# Patient Record
Sex: Female | Born: 1950 | Race: Black or African American | Hispanic: No | Smoking: Former smoker
Health system: Southern US, Community
[De-identification: ages and names within clinical notes are randomized; demographics above are authoritative.]

## PROBLEM LIST (undated history)

## (undated) DIAGNOSIS — C50412 Malignant neoplasm of upper-outer quadrant of left female breast: Secondary | ICD-10-CM

## (undated) DIAGNOSIS — C801 Malignant (primary) neoplasm, unspecified: Secondary | ICD-10-CM

## (undated) DIAGNOSIS — Z9221 Personal history of antineoplastic chemotherapy: Secondary | ICD-10-CM

## (undated) DIAGNOSIS — Z86010 Personal history of colonic polyps: Secondary | ICD-10-CM

## (undated) DIAGNOSIS — G620 Drug-induced polyneuropathy: Secondary | ICD-10-CM

## (undated) DIAGNOSIS — C50919 Malignant neoplasm of unspecified site of unspecified female breast: Secondary | ICD-10-CM

## (undated) DIAGNOSIS — Z923 Personal history of irradiation: Secondary | ICD-10-CM

## (undated) DIAGNOSIS — T451X5A Adverse effect of antineoplastic and immunosuppressive drugs, initial encounter: Secondary | ICD-10-CM

## (undated) HISTORY — DX: Drug-induced polyneuropathy: G62.0

## (undated) HISTORY — DX: Adverse effect of antineoplastic and immunosuppressive drugs, initial encounter: T45.1X5A

## (undated) HISTORY — DX: Personal history of colonic polyps: Z86.010

## (undated) HISTORY — DX: Malignant neoplasm of upper-outer quadrant of left female breast: C50.412

---

## 1975-10-31 HISTORY — PX: TUBAL LIGATION: SHX77

## 2005-10-30 DIAGNOSIS — Z860100 Personal history of colon polyps, unspecified: Secondary | ICD-10-CM

## 2005-10-30 HISTORY — DX: Personal history of colonic polyps: Z86.010

## 2005-10-30 HISTORY — DX: Personal history of colon polyps, unspecified: Z86.0100

## 2006-01-30 ENCOUNTER — Ambulatory Visit: Payer: Self-pay | Admitting: Family Medicine

## 2006-02-15 ENCOUNTER — Ambulatory Visit: Payer: Self-pay | Admitting: General Surgery

## 2007-02-06 ENCOUNTER — Ambulatory Visit: Payer: Self-pay | Admitting: Family Medicine

## 2008-02-04 ENCOUNTER — Encounter: Payer: Self-pay | Admitting: Family Medicine

## 2008-02-11 ENCOUNTER — Ambulatory Visit: Payer: Self-pay | Admitting: Family Medicine

## 2008-02-28 ENCOUNTER — Encounter: Payer: Self-pay | Admitting: Family Medicine

## 2008-03-25 ENCOUNTER — Ambulatory Visit: Payer: Self-pay | Admitting: Podiatry

## 2008-03-30 ENCOUNTER — Encounter: Payer: Self-pay | Admitting: Family Medicine

## 2008-04-22 DIAGNOSIS — F17210 Nicotine dependence, cigarettes, uncomplicated: Secondary | ICD-10-CM | POA: Insufficient documentation

## 2009-03-18 ENCOUNTER — Ambulatory Visit: Payer: Self-pay | Admitting: Family Medicine

## 2010-10-30 HISTORY — PX: BUNIONECTOMY: SHX129

## 2010-12-30 ENCOUNTER — Ambulatory Visit: Payer: Self-pay | Admitting: Family Medicine

## 2011-02-28 ENCOUNTER — Ambulatory Visit: Payer: Self-pay | Admitting: General Surgery

## 2012-02-29 ENCOUNTER — Ambulatory Visit: Payer: Self-pay | Admitting: Family Medicine

## 2013-03-12 ENCOUNTER — Ambulatory Visit: Payer: Self-pay | Admitting: Family Medicine

## 2013-05-15 ENCOUNTER — Encounter: Payer: Self-pay | Admitting: *Deleted

## 2014-06-02 ENCOUNTER — Encounter: Payer: Self-pay | Admitting: Podiatry

## 2014-06-02 ENCOUNTER — Ambulatory Visit (INDEPENDENT_AMBULATORY_CARE_PROVIDER_SITE_OTHER): Payer: 59

## 2014-06-02 ENCOUNTER — Ambulatory Visit (INDEPENDENT_AMBULATORY_CARE_PROVIDER_SITE_OTHER): Payer: 59 | Admitting: Podiatry

## 2014-06-02 VITALS — BP 126/75 | HR 66 | Resp 16 | Ht 64.0 in | Wt 200.0 lb

## 2014-06-02 DIAGNOSIS — M779 Enthesopathy, unspecified: Secondary | ICD-10-CM

## 2014-06-02 MED ORDER — TRIAMCINOLONE ACETONIDE 10 MG/ML IJ SUSP
10.0000 mg | Freq: Once | INTRAMUSCULAR | Status: AC
Start: 2014-06-02 — End: 2014-06-02
  Administered 2014-06-02: 10 mg

## 2014-06-02 NOTE — Progress Notes (Signed)
Subjective:     Patient ID: Theresa Acosta, female   DOB: Feb 02, 1951, 63 y.o.   MRN: [id number]  Foot Pain   patient presents stating that I'm having a lot of pain in the top of my right foot and I am working 16 hours a day and on my feet continuously   Review of Systems  All other systems reviewed and are negative.      Objective:   Physical Exam  Nursing note and vitals reviewed. Constitutional: She is oriented to person, place, and time.  Cardiovascular: Intact distal pulses.   Musculoskeletal: Normal range of motion.  Neurological: She is oriented to person, place, and time.  Skin: Skin is warm.   neurovascular status intact with muscle strength adequate and range of motion of the subtalar and midtarsal joint within normal limits. Patient is found to have good digital perfusion and is noted to have some diminishment of arch height upon evaluation and is noted on the dorsum of the right foot have quite a bit of discomfort in the tendon around anterior tibial proximal to its insertion. No indication of muscle loss currently    Assessment:     Tendinitis of the dorsal anterior tibial muscle group with structure causing part of the problem and arch height being a part of the problem    Plan:     H&P and x-rays reviewed. I carefully injected the sheath of the tendon 3 mg Kenalog 5 mg I can Marcaine mixture and advised on physical therapy and long-term orthotics which were scanned today. Also dispensed fascially brace to lift the arch up and take stress off the tendon

## 2014-06-11 ENCOUNTER — Encounter: Payer: Self-pay | Admitting: *Deleted

## 2014-06-11 NOTE — Progress Notes (Signed)
SPOKE WITH PT LETTING HER KNOW ORTHOTICS ARE HERE. PT HAS APPT WITH DR REGAL ON 8.25.15

## 2014-06-23 ENCOUNTER — Ambulatory Visit (INDEPENDENT_AMBULATORY_CARE_PROVIDER_SITE_OTHER): Payer: 59 | Admitting: Podiatry

## 2014-06-23 ENCOUNTER — Encounter: Payer: Self-pay | Admitting: Podiatry

## 2014-06-23 VITALS — BP 128/69 | HR 74 | Resp 12

## 2014-06-23 DIAGNOSIS — M779 Enthesopathy, unspecified: Secondary | ICD-10-CM

## 2014-06-23 MED ORDER — TRIAMCINOLONE ACETONIDE 10 MG/ML IJ SUSP
10.0000 mg | Freq: Once | INTRAMUSCULAR | Status: AC
  Administered 2014-06-23: 10 mg

## 2014-06-23 NOTE — Progress Notes (Signed)
Subjective:     Patient ID: Theresa Acosta, female   DOB: 1951/09/26, 63 y.o.   MRN: [id number]  HPI patient states that I still having pain not as intense but present and I still have been working 16 hours a day   Review of Systems     Objective:   Physical Exam Neurovascular status intact with continued discomfort on the dorsum of the right foot around the anterior tibial tendon and ankle joint with the pain been slightly medial over previous    Assessment:     Tendinitis still present with excessive activity probably part of the problem    Plan:     Reinjected the plantar fascia 3 mg Kenalog 5 mg Xylocaine and advised on heat followed by ice and then went ahead and dispensed orthotics with all instructions on usage

## 2014-07-21 ENCOUNTER — Ambulatory Visit: Payer: 59 | Admitting: Podiatry

## 2015-10-31 DIAGNOSIS — Z9221 Personal history of antineoplastic chemotherapy: Secondary | ICD-10-CM

## 2015-10-31 DIAGNOSIS — Z923 Personal history of irradiation: Secondary | ICD-10-CM

## 2015-10-31 DIAGNOSIS — G620 Drug-induced polyneuropathy: Secondary | ICD-10-CM

## 2015-10-31 DIAGNOSIS — T451X5A Adverse effect of antineoplastic and immunosuppressive drugs, initial encounter: Secondary | ICD-10-CM

## 2015-10-31 HISTORY — DX: Personal history of irradiation: Z92.3

## 2015-10-31 HISTORY — DX: Personal history of antineoplastic chemotherapy: Z92.21

## 2015-10-31 HISTORY — DX: Drug-induced polyneuropathy: G62.0

## 2015-10-31 HISTORY — DX: Drug-induced polyneuropathy: T45.1X5A

## 2015-12-14 ENCOUNTER — Encounter: Payer: Self-pay | Admitting: *Deleted

## 2016-03-01 ENCOUNTER — Encounter: Payer: Self-pay | Admitting: General Surgery

## 2016-03-02 ENCOUNTER — Encounter: Payer: Self-pay | Admitting: General Surgery

## 2016-03-02 ENCOUNTER — Ambulatory Visit (INDEPENDENT_AMBULATORY_CARE_PROVIDER_SITE_OTHER): Payer: 59 | Admitting: General Surgery

## 2016-03-02 ENCOUNTER — Other Ambulatory Visit: Payer: Self-pay | Admitting: Family Medicine

## 2016-03-02 VITALS — BP 138/82 | HR 78 | Resp 12 | Ht 62.0 in | Wt 180.0 lb

## 2016-03-02 DIAGNOSIS — Z1231 Encounter for screening mammogram for malignant neoplasm of breast: Secondary | ICD-10-CM

## 2016-03-02 DIAGNOSIS — Z86010 Personal history of colonic polyps: Secondary | ICD-10-CM | POA: Diagnosis not present

## 2016-03-02 MED ORDER — POLYETHYLENE GLYCOL 3350 17 GM/SCOOP PO POWD: ORAL | Status: DC

## 2016-03-02 NOTE — Progress Notes (Signed)
Patient ID: Theresa Acosta, female   DOB: September 18, 1951, 65 y.o.   MRN: [id number]  Chief Complaint  Patient presents with  . Colonoscopy    HPI Theresa Acosta is a 65 y.o. female.  Who presents for a colonoscopy discussion. The last colonoscopy was completed in 2012 . Denies any gastrointestinal issues. Bowels move regular( every other day) and no bleeding noted. Denies family history of cancer. 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  I personally reviewed the patient's history.  HPI  Past Medical History  Diagnosis Date  . Personal history of colonic polyps 2007    Past Surgical History  Procedure Laterality Date  . Tubal ligation  1977  . Colonoscopy  2007 ( serated adenoma of the rectum), 2012 (normal)    Dr Bary Castilla  . Bunionectomy  2012    Family History  Problem Relation Age of Onset  . Cancer Neg Hx     Social History Social History  Substance Use Topics  . Smoking status: Current Some Day Smoker -- 1.00 packs/day for 8 years  . Smokeless tobacco: Never Used  . Alcohol Use: 0.0 oz/week    0 Standard drinks or equivalent per week    No Known Allergies  Current Outpatient Prescriptions  Medication Sig Dispense Refill  . polyethylene glycol powder (GLYCOLAX/MIRALAX) powder 255 grams one bottle for colonoscopy prep 255 g 0   No current facility-administered medications for this visit.    Review of Systems Review of Systems  Constitutional: Negative.   Respiratory: Negative.   Cardiovascular: Negative.   Gastrointestinal: Negative for nausea, diarrhea, constipation and blood in stool.    Blood pressure 138/82, pulse 78, resp. rate 12, height 5\' 2"  (1.575 m), weight 180 lb (81.647 kg).  Physical Exam Physical Exam  Constitutional: She is oriented to person, place, and time. She appears well-developed and well-nourished.  HENT:  Mouth/Throat: Oropharynx is clear and moist.   Eyes: Conjunctivae are normal. No scleral icterus.  Neck: Neck supple.  Cardiovascular: Normal rate, regular rhythm and normal heart sounds.   Pulmonary/Chest: Effort normal and breath sounds normal.  Lymphadenopathy:    She has no cervical adenopathy.  Neurological: She is alert and oriented to person, place, and time.  Skin: Skin is warm and dry.  Psychiatric: Her behavior is normal.    Data Reviewed 2007 colonoscopy report showed a serrated adenoma of the rectum as well as several hyperplastic polyps.  Assessment    Previous colonic polyp.    Plan    Colonoscopy with possible biopsy/polypectomy prn: Information regarding the procedure, including its potential risks and complications (including but not limited to perforation of the bowel, which may require emergency surgery to repair, and bleeding) was verbally given to the patient. Educational information regarding lower intestinal endoscopy was given to the patient. Written instructions for how to complete the bowel prep using Miralax were provided. The importance of drinking ample fluids to avoid dehydration as a result of the prep emphasized.     Patient has been scheduled for a colonoscopy on 03-21-16 at [facility].   PCP:  Margarita Rana This information has been scribed by Karie Fetch RN, BSN,BC.   Robert Bellow 03/03/2016, 6:16 AM

## 2016-03-02 NOTE — Patient Instructions (Addendum)
The patient is aware to call back for any questions or concerns. Colonoscopy A colonoscopy is an exam to look at the entire large intestine (colon). This exam can help find problems such as tumors, polyps, inflammation, and areas of bleeding. The exam takes about 1 hour.  LET [facility] CARE PROVIDER KNOW ABOUT:   Any allergies you have.  All medicines you are taking, including vitamins, herbs, eye drops, creams, and over-the-counter medicines.  Previous problems you or members of your family have had with the use of anesthetics.  Any blood disorders you have.  Previous surgeries you have had.  Medical conditions you have. RISKS AND COMPLICATIONS  Generally, this is a safe procedure. However, as with any procedure, complications can occur. Possible complications include:  Bleeding.  Tearing or rupture of the colon wall.  Reaction to medicines given during the exam.  Infection (rare). BEFORE THE PROCEDURE   Ask your health care provider about changing or stopping your regular medicines.  You may be prescribed an oral bowel prep. This involves drinking a large amount of medicated liquid, starting the day before your procedure. The liquid will cause you to have multiple loose stools until your stool is almost clear or light green. This cleans out your colon in preparation for the procedure.  Do not eat or drink anything else once you have started the bowel prep, unless your health care provider tells you it is safe to do so.  Arrange for someone to drive you home after the procedure. PROCEDURE   You will be given medicine to help you relax (sedative).  You will lie on your side with your knees bent.  A long, flexible tube with a light and camera on the end (colonoscope) will be inserted through the rectum and into the colon. The camera sends video back to a computer screen as it moves through the colon. The colonoscope also releases carbon dioxide gas to inflate the colon. This  helps your health care provider see the area better.  During the exam, your health care provider may take a small tissue sample (biopsy) to be examined under a microscope if any abnormalities are found.  The exam is finished when the entire colon has been viewed. AFTER THE PROCEDURE   Do not drive for 24 hours after the exam.  You may have a small amount of blood in your stool.  You may pass moderate amounts of gas and have mild abdominal cramping or bloating. This is caused by the gas used to inflate your colon during the exam.  Ask when your test results will be ready and how you will get your results. Make sure you get your test results.   This information is not intended to replace advice given to you by your health care provider. Make sure you discuss any questions you have with your health care provider.   Document Released: 10/13/2000 Document Revised: 08/06/2013 Document Reviewed: 06/23/2013 Elsevier Interactive Patient Education [employer].  Patient has been scheduled for a colonoscopy on 03-21-16 at [facility].

## 2016-03-03 ENCOUNTER — Encounter: Payer: Self-pay | Admitting: General Surgery

## 2016-03-03 DIAGNOSIS — Z860100 Personal history of colon polyps, unspecified: Secondary | ICD-10-CM | POA: Insufficient documentation

## 2016-03-03 NOTE — H&P (Signed)
HPI  Theresa Acosta is a 65 y.o. female. Who presents for a colonoscopy discussion. The last colonoscopy was completed in 2012 . Denies any gastrointestinal issues. Bowels move regular( every other day) and no bleeding noted. Denies family history of cancer.  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  I personally reviewed the patient's history.  HPI  Past Medical History   Diagnosis  Date   .  Personal history of colonic polyps  2007    Past Surgical History   Procedure  Laterality  Date   .  Tubal ligation   1977   .  Colonoscopy   2007 ( serated adenoma of the rectum), 2012 (normal)     Dr Bary Castilla   .  Bunionectomy   2012    Family History   Problem  Relation  Age of Onset   .  Cancer  Neg Hx     Social History  Social History   Substance Use Topics   .  Smoking status:  Current Some Day Smoker -- 1.00 packs/day for 8 years   .  Smokeless tobacco:  Never Used   .  Alcohol Use:  0.0 oz/week     0 Standard drinks or equivalent per week    No Known Allergies  Current Outpatient Prescriptions   Medication  Sig  Dispense  Refill   .  polyethylene glycol powder (GLYCOLAX/MIRALAX) powder  255 grams one bottle for colonoscopy prep  255 g  0    No current facility-administered medications for this visit.    Review of Systems  Review of Systems  Constitutional: Negative.  Respiratory: Negative.  Cardiovascular: Negative.  Gastrointestinal: Negative for nausea, diarrhea, constipation and blood in stool.   Blood pressure 138/82, pulse 78, resp. rate 12, height 5\' 2"  (1.575 m), weight 180 lb (81.647 kg).  Physical Exam  Physical Exam  Constitutional: She is oriented to person, place, and time. She appears well-developed and well-nourished.  HENT:  Mouth/Throat: Oropharynx is clear and moist.  Eyes: Conjunctivae are normal. No scleral icterus.  Neck: Neck supple.  Cardiovascular: Normal rate,  regular rhythm and normal heart sounds.  Pulmonary/Chest: Effort normal and breath sounds normal.  Lymphadenopathy:  She has no cervical adenopathy.  Neurological: She is alert and oriented to person, place, and time.  Skin: Skin is warm and dry.  Psychiatric: Her behavior is normal.   Data Reviewed  2007 colonoscopy report showed a serrated adenoma of the rectum as well as several hyperplastic polyps.  Assessment   Previous colonic polyp.   Plan   Colonoscopy with possible biopsy/polypectomy prn: Information regarding the procedure, including its potential risks and complications (including but not limited to perforation of the bowel, which may require emergency surgery to repair, and bleeding) was verbally given to the patient. Educational information regarding lower intestinal endoscopy was given to the patient. Written instructions for how to complete the bowel prep using Miralax were provided. The importance of drinking ample fluids to avoid dehydration as a result of the prep emphasized.   Patient has been scheduled for a colonoscopy on 03-21-16 at [facility].  PCP: Margarita Rana  This information has been scribed by Karie Fetch RN, BSN,BC.  Robert Bellow  03/03/2016, 6:16 AM

## 2016-03-15 ENCOUNTER — Ambulatory Visit
Admission: RE | Admit: 2016-03-15 | Discharge: 2016-03-15 | Disposition: A | Discharge status: Home / Self Care | Payer: 59 | Source: Ambulatory Visit | Attending: Family Medicine | Admitting: Family Medicine

## 2016-03-15 DIAGNOSIS — Z1231 Encounter for screening mammogram for malignant neoplasm of breast: Secondary | ICD-10-CM

## 2016-03-16 ENCOUNTER — Other Ambulatory Visit: Payer: Self-pay | Admitting: Family Medicine

## 2016-03-16 DIAGNOSIS — N63 Unspecified lump in unspecified breast: Secondary | ICD-10-CM

## 2016-03-21 ENCOUNTER — Ambulatory Visit: Payer: 59 | Admitting: Registered Nurse

## 2016-03-21 ENCOUNTER — Encounter
Admission: RE | Disposition: A | Discharge status: Home / Self Care | Payer: Self-pay | Source: Ambulatory Visit | Attending: General Surgery

## 2016-03-21 ENCOUNTER — Ambulatory Visit
Admission: RE | Admit: 2016-03-21 | Discharge: 2016-03-21 | Disposition: A | Discharge status: Home / Self Care | Payer: 59 | Source: Ambulatory Visit | Attending: General Surgery | Admitting: General Surgery

## 2016-03-21 DIAGNOSIS — F17200 Nicotine dependence, unspecified, uncomplicated: Secondary | ICD-10-CM | POA: Insufficient documentation

## 2016-03-21 DIAGNOSIS — J449 Chronic obstructive pulmonary disease, unspecified: Secondary | ICD-10-CM | POA: Insufficient documentation

## 2016-03-21 DIAGNOSIS — K5730 Diverticulosis of large intestine without perforation or abscess without bleeding: Secondary | ICD-10-CM | POA: Insufficient documentation

## 2016-03-21 DIAGNOSIS — Z1211 Encounter for screening for malignant neoplasm of colon: Secondary | ICD-10-CM | POA: Insufficient documentation

## 2016-03-21 DIAGNOSIS — Z86010 Personal history of colonic polyps: Secondary | ICD-10-CM | POA: Diagnosis not present

## 2016-03-21 DIAGNOSIS — K5790 Diverticulosis of intestine, part unspecified, without perforation or abscess without bleeding: Secondary | ICD-10-CM | POA: Diagnosis not present

## 2016-03-21 HISTORY — PX: COLONOSCOPY WITH PROPOFOL: SHX5780

## 2016-03-21 SURGERY — COLONOSCOPY WITH PROPOFOL
Anesthesia: General

## 2016-03-21 MED ORDER — PROPOFOL 500 MG/50ML IV EMUL
INTRAVENOUS | Status: DC | PRN
  Administered 2016-03-21: 180 ug/kg/min via INTRAVENOUS

## 2016-03-21 MED ORDER — FENTANYL CITRATE (PF) 100 MCG/2ML IJ SOLN
INTRAMUSCULAR | Status: DC | PRN
  Administered 2016-03-21: 50 ug via INTRAVENOUS

## 2016-03-21 MED ORDER — SODIUM CHLORIDE 0.9 % IV SOLN
INTRAVENOUS | Status: DC
  Administered 2016-03-21: 14:00:00 via INTRAVENOUS

## 2016-03-21 MED ORDER — MIDAZOLAM HCL 2 MG/2ML IJ SOLN
INTRAMUSCULAR | Status: DC | PRN
  Administered 2016-03-21: 1 mg via INTRAVENOUS

## 2016-03-21 MED ORDER — PHENYLEPHRINE HCL 10 MG/ML IJ SOLN
INTRAMUSCULAR | Status: DC | PRN
  Administered 2016-03-21: 100 ug via INTRAVENOUS

## 2016-03-21 MED ORDER — PROPOFOL 10 MG/ML IV BOLUS
INTRAVENOUS | Status: DC | PRN
  Administered 2016-03-21: 50 mg via INTRAVENOUS

## 2016-03-21 NOTE — Op Note (Signed)
[facility] Patient Name: Theresa Acosta Procedure Date: 03/21/2016 2:04 PM MRN: [id number] Account #: [id number] Date of Birth: Aug 13, 1951 Admit Type: Outpatient Age: 65 Room: [facility] ENDO ROOM 1 Gender: Female Note Status: Finalized Procedure:            Colonoscopy Indications:          High risk colon cancer surveillance: Personal history                        of colonic polyps Providers:            Robert Bellow, MD Referring MD:         Jerrell Belfast, MD (Referring MD) Medicines:            Monitored Anesthesia Care Complications:        No immediate complications. Procedure:            Pre-Anesthesia Assessment:                       - Prior to the procedure, a History and Physical was                        performed, and patient medications, allergies and                        sensitivities were reviewed. The patient's tolerance of                        previous anesthesia was reviewed.                       - The risks and benefits of the procedure and the                        sedation options and risks were discussed with the                        patient. All questions were answered and informed                        consent was obtained.                       After obtaining informed consent, the colonoscope was                        passed under direct vision. Throughout the procedure,                        the patient's blood pressure, pulse, and oxygen                        saturations were monitored continuously. The                        Colonoscope was introduced through the anus and                        advanced to the the cecum, identified by the  appendiceal orifice, ileocecal valve and palpation. The                        colonoscopy was performed without difficulty. The                        patient tolerated the procedure well. The quality of                        the bowel  preparation was excellent. Findings:      Many medium-mouthed diverticula were found in the sigmoid colon.      The retroflexed view of the distal rectum and anal verge was normal and       showed no anal or rectal abnormalities. Impression:           - Diverticulosis in the sigmoid colon.                       - The distal rectum and anal verge are normal on                        retroflexion view.                       - No specimens collected. Recommendation:       - Repeat colonoscopy in 10 years for screening purposes. Procedure Code(s):    --- Professional ---                       [phone], Colonoscopy, flexible; diagnostic, including                        collection of specimen(s) by brushing or washing, when                        performed (separate procedure) Diagnosis Code(s):    --- Professional ---                       Z86.010, Personal history of colonic polyps                       K57.30, Diverticulosis of large intestine without                        perforation or abscess without bleeding CPT copyright 2016 American Medical Association. All rights reserved. The codes documented in this report are preliminary and upon coder review may  be revised to meet current compliance requirements. Robert Bellow, MD 03/21/2016 2:36:16 PM This report has been signed electronically. Number of Addenda: 0 Note Initiated On: 03/21/2016 2:04 PM Scope Withdrawal Time: 0 hours 10 minutes 27 seconds  Total Procedure Duration: 0 hours 15 minutes 33 seconds       [facility]

## 2016-03-21 NOTE — H&P (Signed)
No change in clinical history or exam.  

## 2016-03-21 NOTE — Transfer of Care (Signed)
Immediate Anesthesia Transfer of Care Note  Patient: Theresa Acosta  Procedure(s) Performed: Procedure(s): COLONOSCOPY WITH PROPOFOL (N/A)  Patient Location: PACU  Anesthesia Type:General  Level of Consciousness: sedated  Airway & Oxygen Therapy: Patient Spontanous Breathing and Patient connected to face mask oxygen  Post-op Assessment: Report given to RN and Post -op Vital signs reviewed and stable  Post vital signs: Reviewed and stable  Last Vitals:  Filed Vitals:   03/21/16 1341 03/21/16 1447  BP: 143/86 92/53  Pulse: 73 82  Temp: 36.6 C 35.8 C  Resp: 17 20    Complications: No apparent anesthesia complications

## 2016-03-21 NOTE — Anesthesia Procedure Notes (Signed)
Date/Time: 03/21/2016 2:13 PM Performed by: Doreen Salvage Pre-anesthesia Checklist: Patient identified, Emergency Drugs available, Suction available and Patient being monitored Patient Re-evaluated:Patient Re-evaluated prior to inductionOxygen Delivery Method: Nasal cannula Intubation Type: IV induction Dental Injury: Teeth and Oropharynx as per pre-operative assessment  Comments: Nasal cannula with etCO2 monitoring

## 2016-03-21 NOTE — Anesthesia Preprocedure Evaluation (Signed)
Anesthesia Evaluation  Patient identified by MRN, date of birth, ID band Patient awake    Reviewed: Allergy & Precautions, NPO status , Patient's Chart, lab work & pertinent test results  Airway Mallampati: III       Dental  (+) Teeth Intact   Pulmonary COPD, Current Smoker,     + decreased breath sounds      Cardiovascular Exercise Tolerance: Good  Rhythm:Regular     Neuro/Psych    GI/Hepatic negative GI ROS, Neg liver ROS,   Endo/Other  negative endocrine ROS  Renal/GU negative Renal ROS     Musculoskeletal   Abdominal Normal abdominal exam  (+)   Peds  Hematology negative hematology ROS (+)   Anesthesia Other Findings   Reproductive/Obstetrics                             Anesthesia Physical Anesthesia Plan  ASA: II  Anesthesia Plan: General   Post-op Pain Management:    Induction: Intravenous  Airway Management Planned: Natural Airway and Nasal Cannula  Additional Equipment:   Intra-op Plan:   Post-operative Plan:   Informed Consent: I have reviewed the patients History and Physical, chart, labs and discussed the procedure including the risks, benefits and alternatives for the proposed anesthesia with the patient or authorized representative who has indicated his/her understanding and acceptance.     Plan Discussed with: CRNA  Anesthesia Plan Comments:         Anesthesia Quick Evaluation

## 2016-03-22 ENCOUNTER — Encounter: Payer: Self-pay | Admitting: General Surgery

## 2016-03-22 NOTE — Anesthesia Postprocedure Evaluation (Signed)
Anesthesia Post Note  Patient: Theresa Acosta  Procedure(s) Performed: Procedure(s) (LRB): COLONOSCOPY WITH PROPOFOL (N/A)  Patient location during evaluation: PACU Anesthesia Type: General Level of consciousness: awake Pain management: pain level controlled Vital Signs Assessment: post-procedure vital signs reviewed and stable Respiratory status: spontaneous breathing Cardiovascular status: blood pressure returned to baseline Anesthetic complications: no    Last Vitals:  Filed Vitals:   03/21/16 1450 03/21/16 1500  BP: 107/57 106/50  Pulse: 83 75  Temp: 35.8 C   Resp: 20 21    Last Pain: There were no vitals filed for this visit.               VAN STAVEREN,Renezmae Canlas

## 2016-03-28 ENCOUNTER — Ambulatory Visit
Admission: RE | Admit: 2016-03-28 | Discharge: 2016-03-28 | Disposition: A | Discharge status: Home / Self Care | Payer: 59 | Source: Ambulatory Visit | Attending: Family Medicine | Admitting: Family Medicine

## 2016-03-28 ENCOUNTER — Other Ambulatory Visit: Payer: Self-pay | Admitting: Family Medicine

## 2016-03-28 DIAGNOSIS — N63 Unspecified lump in unspecified breast: Secondary | ICD-10-CM

## 2016-03-29 ENCOUNTER — Encounter: Payer: Self-pay | Admitting: Family Medicine

## 2016-03-29 ENCOUNTER — Ambulatory Visit (INDEPENDENT_AMBULATORY_CARE_PROVIDER_SITE_OTHER): Payer: 59 | Admitting: Family Medicine

## 2016-03-29 VITALS — BP 126/84 | HR 68 | Temp 98.2°F | Resp 16 | Ht 62.0 in | Wt 181.0 lb

## 2016-03-29 DIAGNOSIS — E559 Vitamin D deficiency, unspecified: Secondary | ICD-10-CM | POA: Diagnosis not present

## 2016-03-29 DIAGNOSIS — R739 Hyperglycemia, unspecified: Secondary | ICD-10-CM | POA: Insufficient documentation

## 2016-03-29 DIAGNOSIS — Z124 Encounter for screening for malignant neoplasm of cervix: Secondary | ICD-10-CM

## 2016-03-29 DIAGNOSIS — R319 Hematuria, unspecified: Secondary | ICD-10-CM | POA: Insufficient documentation

## 2016-03-29 DIAGNOSIS — E7800 Pure hypercholesterolemia, unspecified: Secondary | ICD-10-CM | POA: Insufficient documentation

## 2016-03-29 DIAGNOSIS — Z87891 Personal history of nicotine dependence: Secondary | ICD-10-CM

## 2016-03-29 DIAGNOSIS — Z0000 Encounter for general adult medical examination without abnormal findings: Secondary | ICD-10-CM | POA: Diagnosis not present

## 2016-03-29 DIAGNOSIS — E669 Obesity, unspecified: Secondary | ICD-10-CM | POA: Insufficient documentation

## 2016-03-29 LAB — POCT URINALYSIS DIPSTICK
Bilirubin, UA: NEGATIVE
Glucose, UA: NEGATIVE
KETONES UA: NEGATIVE
Leukocytes, UA: NEGATIVE
Nitrite, UA: NEGATIVE
PH UA: 7
PROTEIN UA: NEGATIVE
SPEC GRAV UA: 1.025
UROBILINOGEN UA: 0.2

## 2016-03-29 NOTE — Progress Notes (Signed)
Patient ID: Theresa Acosta, female   DOB: 1951-02-27, 65 y.o.   MRN: [id number]       Patient: Theresa Acosta, Female    DOB: 1951/09/25, 65 y.o.   MRN: [id number] Visit Date: 03/29/2016  Today's Provider: Margarita Rana, MD   Chief Complaint  Patient presents with  . Annual Exam   Subjective:    Annual physical exam [name] is a 65 y.o. female who presents today for health maintenance and complete physical. She feels well. She reports exercising none. She reports she is sleeping well.  01/04/15 CPE 03/14/13 Pap-neg; HPV-neg 03/28/16 Mammogram-BI-RADS 4 03/21/16 Colonoscopy-diverticulosis, recheck in 10 yrs, Dr. Bary Castilla  -----------------------------------------------------------------   Review of Systems  Constitutional: Negative.   HENT: Negative.   Eyes: Negative.   Respiratory: Negative.   Cardiovascular: Negative.   Gastrointestinal: Negative.   Endocrine: Negative.   Genitourinary: Negative.   Musculoskeletal: Negative.   Skin: Negative.   Allergic/Immunologic: Negative.   Neurological: Negative.   Hematological: Negative.   Psychiatric/Behavioral: Negative.     Social History      She  reports that she has been smoking.  She has never used smokeless tobacco. She reports that she does not drink alcohol or use illicit drugs.       Social History   Social History  . Marital Status: Divorced    Spouse Name: N/A  . Number of Children: 2  . Years of Education: N/A   Social History Main Topics  . Smoking status: Current Some Day Smoker -- 1.00 packs/day for 8 years  . Smokeless tobacco: Never Used  . Alcohol Use: No  . Drug Use: No  . Sexual Activity: Not Asked   Other Topics Concern  . None   Social History Narrative    Past Medical History  Diagnosis Date  . Personal history of colonic polyps 2007     Patient Active Problem List   Diagnosis Date Noted  . Blood in the urine 03/29/2016  . Hypercholesteremia 03/29/2016  . Blood  glucose elevated 03/29/2016  . Adiposity 03/29/2016  . Avitaminosis D 03/29/2016  . History of colonic polyps 03/03/2016  . Current tobacco use 04/22/2008    Past Surgical History  Procedure Laterality Date  . Tubal ligation  1977  . Colonoscopy  2007 ( serated adenoma of the rectum), 2012 (normal)    Dr Bary Castilla  . Bunionectomy  2012  . Colonoscopy with propofol N/A 03/21/2016    Procedure: COLONOSCOPY WITH PROPOFOL;  Surgeon: Robert Bellow, MD;  Location: [facility] ENDOSCOPY;  Service: Endoscopy;  Laterality: N/A;    Family History        Family Status  Relation Status Death Age  . Father Deceased 21  . Mother Deceased 51    pneumonia  . Sister Alive   . Brother Alive   . Sister Alive   . Brother Alive   . Brother Alive         Her family history includes Healthy in her brother, brother, brother, sister, and sister. There is no history of Cancer or Breast cancer.    No Known Allergies  Previous Medications   No medications on file    Patient Care Team: Margarita Rana, MD as PCP - General (Family Medicine) Robert Bellow, MD (General Surgery) Margarita Rana, MD as Referring Physician (Family Medicine)     Objective:   Vitals: BP 126/84 mmHg  Pulse 68  Temp(Src) 98.2 F (36.8 C) (Oral)  Resp 16  Ht 5\' 2"  (1.575 m)  Wt 181 lb (82.101 kg)  BMI 33.10 kg/m2   Physical Exam  Constitutional: She is oriented to person, place, and time. She appears well-developed and well-nourished.  HENT:  Head: Normocephalic and atraumatic.  Right Ear: Tympanic membrane, external ear and ear canal normal.  Left Ear: Tympanic membrane, external ear and ear canal normal.  Nose: Nose normal.  Mouth/Throat: Uvula is midline, oropharynx is clear and moist and mucous membranes are normal.  Eyes: Conjunctivae, EOM and lids are normal. Pupils are equal, round, and reactive to light.  Neck: Trachea normal and normal range of motion. Neck supple. Carotid bruit is not present. No thyroid  mass and no thyromegaly present.  Cardiovascular: Normal rate, regular rhythm and normal heart sounds.   Pulmonary/Chest: Effort normal and breath sounds normal.  Abdominal: Soft. Normal appearance and bowel sounds are normal. There is no hepatosplenomegaly. There is no tenderness.  Genitourinary: Vagina normal. No breast swelling, tenderness or discharge.  Musculoskeletal: Normal range of motion.  Lymphadenopathy:    She has no cervical adenopathy.    She has no axillary adenopathy.  Neurological: She is alert and oriented to person, place, and time. She has normal strength. No cranial nerve deficit.  Skin: Skin is warm, dry and intact.  Psychiatric: She has a normal mood and affect. Her speech is normal and behavior is normal. Judgment and thought content normal. Cognition and memory are normal.     Depression Screen PHQ 2/9 Scores 03/29/2016  PHQ - 2 Score 0      Assessment & Plan:     Routine Health Maintenance and Physical Exam  Exercise Activities and Dietary recommendations Goals    . Exercise 150 minutes per week (moderate activity)       Immunization History  Administered Date(s) Administered  . Td 01/04/2015  . Tdap 01/04/2015     1. Annual physical exam Stable. Patient advised to continue eating healthy and exercise daily. - POCT urinalysis dipstick  2. Blood in the urine Patient has h/o blood in urine. F/U pending lab report. - Urine Microscopic  3. Cervical cancer screening F/U pending lab report. - Pap IG and HPV (high risk) DNA detection  4. Avitaminosis D - VITAMIN D 25 Hydroxy (Vit-D Deficiency, Fractures)  5. Blood glucose elevated - Hemoglobin A1c  6. Hypercholesteremia - CBC with Differential/Platelet - Comprehensive metabolic panel - Lipid Panel With LDL/HDL Ratio - TSH  Patient seen and examined by Dr. Jerrell Belfast, and note scribed by Philbert Riser. Dimas, CMA.  I have reviewed the document for accuracy and completeness and I  agree with above. Jerrell Belfast, MD   Margarita Rana, MD   --------------------------------------------------------------------

## 2016-03-30 ENCOUNTER — Telehealth: Payer: Self-pay

## 2016-03-30 LAB — URINALYSIS, MICROSCOPIC ONLY
Bacteria, UA: NONE SEEN
Casts: NONE SEEN /lpf

## 2016-03-30 NOTE — Telephone Encounter (Signed)
Pt advised.   Thanks,   -Brynnan Rodenbaugh  

## 2016-03-30 NOTE — Telephone Encounter (Signed)
-----   Message from Margarita Rana, MD sent at 03/30/2016  6:33 AM EDT ----- Urine normal. Please notify patient. Thanks.

## 2016-04-01 LAB — PAP IG AND HPV HIGH-RISK
HPV, high-risk: NEGATIVE
PAP Smear Comment: 0

## 2016-04-04 ENCOUNTER — Telehealth: Payer: Self-pay

## 2016-04-04 NOTE — Telephone Encounter (Signed)
Advised pt of lab results. Pt verbally acknowledges understanding. Emily Drozdowski, CMA   

## 2016-04-04 NOTE — Telephone Encounter (Signed)
-----   Message from Margarita Rana, MD sent at 04/02/2016  7:08 AM EDT ----- Pap is normal. Please notify patient.   Thanks.

## 2016-04-10 ENCOUNTER — Telehealth: Payer: Self-pay | Admitting: *Deleted

## 2016-04-10 NOTE — Telephone Encounter (Signed)
Received referral for initial lung cancer screening scan. Contacted patient and obtained smoking history,(quit 01/2016, 60 pack year history) as well as answering questions related to screening process. Patient denies signs of lung cancer such as weight loss or hemoptysis. Patient denies comorbidity that would prevent curative treatment if lung cancer were found. Patient is tentatively scheduled for shared decision making visit and CT scan on 04/21/16 at 2pm, pending insurance approval from business office.

## 2016-04-12 ENCOUNTER — Ambulatory Visit
Admission: RE | Admit: 2016-04-12 | Discharge: 2016-04-12 | Disposition: A | Discharge status: Home / Self Care | Payer: 59 | Source: Ambulatory Visit | Attending: Family Medicine | Admitting: Family Medicine

## 2016-04-12 ENCOUNTER — Other Ambulatory Visit: Payer: Self-pay | Admitting: Family Medicine

## 2016-04-12 DIAGNOSIS — N63 Unspecified lump in unspecified breast: Secondary | ICD-10-CM

## 2016-04-12 DIAGNOSIS — C50912 Malignant neoplasm of unspecified site of left female breast: Secondary | ICD-10-CM | POA: Diagnosis not present

## 2016-04-12 DIAGNOSIS — C50412 Malignant neoplasm of upper-outer quadrant of left female breast: Secondary | ICD-10-CM | POA: Diagnosis not present

## 2016-04-12 HISTORY — PX: BREAST BIOPSY: SHX20

## 2016-04-13 LAB — SURGICAL PATHOLOGY

## 2016-04-14 ENCOUNTER — Telehealth: Payer: Self-pay | Admitting: Family Medicine

## 2016-04-14 DIAGNOSIS — C50919 Malignant neoplasm of unspecified site of unspecified female breast: Secondary | ICD-10-CM | POA: Insufficient documentation

## 2016-04-17 ENCOUNTER — Encounter: Payer: Self-pay | Admitting: *Deleted

## 2016-04-17 NOTE — Telephone Encounter (Signed)
Spoke with patient.  Will refer to Dr. Bary Castilla. Thanks.

## 2016-04-18 ENCOUNTER — Telehealth: Payer: Self-pay | Admitting: *Deleted

## 2016-04-18 ENCOUNTER — Encounter: Payer: Self-pay | Admitting: General Surgery

## 2016-04-18 ENCOUNTER — Ambulatory Visit (INDEPENDENT_AMBULATORY_CARE_PROVIDER_SITE_OTHER): Payer: 59 | Admitting: General Surgery

## 2016-04-18 VITALS — BP 162/82 | HR 64 | Resp 12 | Ht 63.0 in | Wt 179.0 lb

## 2016-04-18 DIAGNOSIS — Z171 Estrogen receptor negative status [ER-]: Secondary | ICD-10-CM | POA: Insufficient documentation

## 2016-04-18 DIAGNOSIS — C50412 Malignant neoplasm of upper-outer quadrant of left female breast: Secondary | ICD-10-CM | POA: Diagnosis not present

## 2016-04-18 NOTE — Telephone Encounter (Signed)
Notified patient as instructed, patient pleased. Discussed follow-up appointments, patient agrees  

## 2016-04-18 NOTE — Telephone Encounter (Signed)
Notified patient as instructed, appointment made with Dr Bary Castilla for July 11 (pt request after holidays), patient agrees.

## 2016-04-18 NOTE — Telephone Encounter (Signed)
-----   Message from Robert Bellow, MD sent at 04/18/2016  3:17 PM EDT ----- Please notify the patient that I reviewed the right breast ultrasound, and I would encourage her to have a right breast vacuum biopsy prior to her left breast surgery. This will remove the need for 6 month follow-up and to the right breast of any concern. This can be done either through our office or with the staff at [facility]. Thank you

## 2016-04-18 NOTE — Progress Notes (Signed)
Patient ID: Theresa Acosta, female   DOB: 12/05/1950, 65 y.o.   MRN: [id number]  Chief Complaint  Patient presents with  . Other    mammogram and biopsy    HPI [name] is a 65 y.o. female who presents for a breast evaluation.The patient underwent screening mammogram followed by additional views and subsequent radiologic biopsy. The most recent mammogram was done on 03/28/16 and left breast ultrasound and biopsy done on 04/12/16 .  Patient does perform regular self breast checks and gets regular mammograms done. Friend Julieta Bellini present.  I person reviewed the patient's history.  HPI  Past Medical History  Diagnosis Date  . Personal history of colonic polyps 2007    Past Surgical History  Procedure Laterality Date  . Tubal ligation  1977  . Colonoscopy  2007 ( serated adenoma of the rectum), 2012 (normal)    Dr Bary Castilla  . Bunionectomy  2012  . Colonoscopy with propofol N/A 03/21/2016    Procedure: COLONOSCOPY WITH PROPOFOL;  Surgeon: Robert Bellow, MD;  Location: [facility] ENDOSCOPY;  Service: Endoscopy;  Laterality: N/A;  . Breast biopsy Left 04/12/16    Family History  Problem Relation Age of Onset  . Cancer Neg Hx   . Breast cancer Neg Hx   . Healthy Sister   . Healthy Brother   . Healthy Sister   . Healthy Brother   . Healthy Brother     Social History Social History  Substance Use Topics  . Smoking status: Former Smoker -- 2.00 packs/day for 15 years    Quit date: 02/27/2016  . Smokeless tobacco: Never Used  . Alcohol Use: No    No Known Allergies  No current outpatient prescriptions on file.   No current facility-administered medications for this visit.    Review of Systems Review of Systems  Constitutional: Negative.   Respiratory: Negative.   Cardiovascular: Negative.     Blood pressure 162/82, pulse 64, resp. rate 12, height 5\' 3"  (1.6 m), weight 179 lb (81.194 kg).  Physical Exam Physical Exam  Constitutional: She is oriented to  person, place, and time. She appears well-developed and well-nourished.  Eyes: Conjunctivae are normal. No scleral icterus.  Neck: Neck supple.  Cardiovascular: Normal rate, regular rhythm and normal heart sounds.   Pulmonary/Chest: Effort normal and breath sounds normal. Right breast exhibits no inverted nipple, no mass, no nipple discharge, no skin change and no tenderness. Left breast exhibits no inverted nipple, no nipple discharge, no skin change and no tenderness.  Abdominal: Soft. Bowel sounds are normal. There is no tenderness.  Lymphadenopathy:    She has no cervical adenopathy.    She has no axillary adenopathy.  Neurological: She is alert and oriented to person, place, and time.  Skin: Skin is warm and dry.    Data Reviewed Screening mammograms dated 03/15/2016 suggested possible masses in both breasts for which additional views were requested. BI-RADS-0.  Ultrasound completed on 03/28/2016 of the right breast lesion showed a 6 mm heterogeneous hypoechoic mass measuring up to 0.6 cm with no Doppler flow. Thought to be a complex cyst. Follow-up exam in 6 months recommended. BI-RADS-3   Left breast ultrasound showed a 7 mm hypoechoic circumscribed mass with a possible cystic area within which was felt to be indeterminant. BI-RADS-4.  Ultrasound-guided biopsy dated 04/12/2016 was completed with findings of a papillary tumor.  A. BREAST, LEFT, 1 O'CLOCK; ULTRASOUND-GUIDED CORE BIOPSY:  - MAMMARY CARCINOMA WITH HIGH NUCLEAR GRADE AND PAPILLARY FEATURES.  Comment:  In this sample the lesion spans 6 mm. No invasion is identified, but  definitive classification should be based on the excised lesion. ER and  PR testing is deferred for assessment of the excised lesion.   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    Plan     The majority of the one hour visit was spent reviewing the options for breast cancer treatment.  Breast conservation with lumpectomy and radiation therapy  was presented as equivalent to mastectomy for long-term control. The pros and cons of each treatment regimen were reviewed. The indications for additional therapy such as chemotherapy were touched on briefly, realizing that the majority of information required to determine if chemotherapy would be of benefit is not available at this time. The availability of consultation services for medical oncology and radiation oncology prior to surgery were reviewed. Informational brochure and website resources provided.  At this time the patient is confident that she would desire breast conservation. Indications for postoperative radiation therapy were reviewed.  The patient is concerned that several her coworkers have a vacation to planned and she doesn't want interfere with that or leave her place of employment shortstaffed. She was commended for her consideration for her colleagues, but reminded that she is the one with cancer.     After the patient had left the office I reviewed the right breast ultrasound findings. This is an indeterminate lesion, and if anything more troublesome to me than the left side which has been confirmed to be a papillary neoplasm. I will be encouraging the patient consider a vacuum biopsy of the right breast lesion prior to formal treatment of her known left breast cancer. PCP:  Margarita Rana This information has been scribed by Karie Fetch RN, BSN,BC.    Robert Bellow 04/18/2016, 2:52 PM

## 2016-04-18 NOTE — Telephone Encounter (Signed)
-----   Message from Robert Bellow, MD sent at 04/18/2016  3:17 PM EDT ----- Please notify the patient that I reviewed the right breast ultrasound, and I would encourage her to have a right breast vacuum biopsy prior to her left breast surgery. This will remove the need for 6 month follow-up and to the right breast of any concern. This can be done either through our office or with the staff at [facility]. Thank you

## 2016-04-20 ENCOUNTER — Other Ambulatory Visit: Payer: Self-pay | Admitting: Family Medicine

## 2016-04-20 DIAGNOSIS — Z87891 Personal history of nicotine dependence: Secondary | ICD-10-CM | POA: Insufficient documentation

## 2016-04-21 ENCOUNTER — Ambulatory Visit
Admission: RE | Admit: 2016-04-21 | Discharge: 2016-04-21 | Disposition: A | Discharge status: Home / Self Care | Payer: 59 | Source: Ambulatory Visit | Attending: Family Medicine | Admitting: Family Medicine

## 2016-04-21 ENCOUNTER — Encounter: Payer: Self-pay | Admitting: Family Medicine

## 2016-04-21 ENCOUNTER — Inpatient Hospital Stay: Payer: 59 | Attending: Family Medicine | Admitting: Family Medicine

## 2016-04-21 DIAGNOSIS — Z87891 Personal history of nicotine dependence: Secondary | ICD-10-CM | POA: Insufficient documentation

## 2016-04-21 DIAGNOSIS — Z122 Encounter for screening for malignant neoplasm of respiratory organs: Secondary | ICD-10-CM

## 2016-04-21 NOTE — Progress Notes (Signed)
In accordance with CMS guidelines, patient has meet eligibility criteria including age, absence of signs or symptoms of lung cancer, the specific calculation of cigarette smoking pack-years was 60 years and is a former smoker, having quit in 2016.   A shared decision-making session was conducted prior to the performance of CT scan. This includes one or more decision aids, includes benefits and harms of screening, follow-up diagnostic testing, over-diagnosis, false positive rate, and total radiation exposure.  Counseling on the importance of adherence to annual lung cancer LDCT screening, impact of co-morbidities, and ability or willingness to undergo diagnosis and treatment is imperative for compliance of the program.  Counseling on the importance of continued smoking cessation for former smokers; the importance of smoking cessation for current smokers and information about tobacco cessation interventions have been given to patient including the [place] [facility], [employer] quit [place], as well as [place] specific smoking cessation programs.  Written order for lung cancer screening with LDCT has been given to the patient and any and all questions have been answered to the best of my abilities.   Yearly follow up will be scheduled by Burgess Estelle, Thoracic Navigator.

## 2016-04-24 ENCOUNTER — Telehealth: Payer: Self-pay | Admitting: *Deleted

## 2016-04-24 NOTE — Telephone Encounter (Signed)
Notified patient of LDCT lung cancer screening results with recommendation for 12 month follow up imaging. Patient verbalizes understanding.   IMPRESSION: 1. Lung-RADS Category 2, benign appearance or behavior. Continue annual screening with low-dose chest CT without contrast in 12 months

## 2016-04-29 DIAGNOSIS — C50919 Malignant neoplasm of unspecified site of unspecified female breast: Secondary | ICD-10-CM

## 2016-04-29 HISTORY — DX: Malignant neoplasm of unspecified site of unspecified female breast: C50.919

## 2016-05-08 NOTE — Progress Notes (Signed)
  Oncology Nurse Navigator Documentation  Navigator Location: [facility] (04/20/16 1456) Navigator Encounter Type: Telephone (04/20/16 1456)   Abnormal Finding Date: 03/28/16 (05/08/16 1500) Confirmed Diagnosis Date: 04/12/16 (05/08/16 1500)       Treatment Phase: Pre-Tx/Tx Discussion (05/08/16 1500) Barriers/Navigation Needs: Education (05/08/16 1500) Education: Preparing for Upcoming Surgery/ Treatment (05/08/16 1500) Interventions: Education Method (05/08/16 1500)     Education Method: Teach-back;Verbal (05/08/16 1500)      Acuity: Level 2 (05/08/16 1500)   Acuity Level 2: Initial guidance, education and coordination as needed;Educational needs (05/08/16 1500)     Time Spent with Patient: 15 (05/08/16 1500)   Phoned patient as follow-up.  States she is going for adfdional biopsy with Dr. Bary Castilla on 05/09/16.

## 2016-05-09 ENCOUNTER — Encounter: Payer: Self-pay | Admitting: General Surgery

## 2016-05-09 ENCOUNTER — Other Ambulatory Visit: Payer: Self-pay

## 2016-05-09 ENCOUNTER — Ambulatory Visit (INDEPENDENT_AMBULATORY_CARE_PROVIDER_SITE_OTHER): Payer: 59 | Admitting: General Surgery

## 2016-05-09 VITALS — BP 138/78 | HR 64 | Resp 12 | Ht 63.0 in | Wt 183.0 lb

## 2016-05-09 DIAGNOSIS — C50912 Malignant neoplasm of unspecified site of left female breast: Secondary | ICD-10-CM

## 2016-05-09 DIAGNOSIS — N6320 Unspecified lump in the left breast, unspecified quadrant: Secondary | ICD-10-CM

## 2016-05-09 DIAGNOSIS — N63 Unspecified lump in breast: Secondary | ICD-10-CM

## 2016-05-09 DIAGNOSIS — N6310 Unspecified lump in the right breast, unspecified quadrant: Secondary | ICD-10-CM

## 2016-05-09 DIAGNOSIS — N6011 Diffuse cystic mastopathy of right breast: Secondary | ICD-10-CM | POA: Diagnosis not present

## 2016-05-09 HISTORY — PX: BREAST BIOPSY: SHX20

## 2016-05-09 NOTE — Progress Notes (Signed)
Patient ID: Theresa Acosta, female   DOB: 04/28/1951, 65 y.o.   MRN: [id number]  Chief Complaint  Patient presents with  . Procedure    right breast biopsy    HPI Theresa Acosta is a 65 y.o. female here today for a right breast biopsy. Review the patient's imaging studies completed after her last visit suggested biopsy was indicated for an area felt to represent a complex cyst prior to treatment of her known left breast cancer.   HPI  Past Medical History  Diagnosis Date  . Personal history of colonic polyps 2007    Past Surgical History  Procedure Laterality Date  . Tubal ligation  1977  . Colonoscopy  2007 ( serated adenoma of the rectum), 2012 (normal)    Dr Bary Castilla  . Bunionectomy  2012  . Colonoscopy with propofol N/A 03/21/2016    Procedure: COLONOSCOPY WITH PROPOFOL;  Surgeon: Robert Bellow, MD;  Location: [facility] ENDOSCOPY;  Service: Endoscopy;  Laterality: N/A;  . Breast biopsy Left 04/12/16    Papillary carcinoma without definite invasion, receptor status deferred to formal excision specimen..    Family History  Problem Relation Age of Onset  . Cancer Neg Hx   . Breast cancer Neg Hx   . Healthy Sister   . Healthy Brother   . Healthy Sister   . Healthy Brother   . Healthy Brother     Social History Social History  Substance Use Topics  . Smoking status: Former Smoker -- 2.00 packs/day for 30 years    Quit date: 02/27/2016  . Smokeless tobacco: Never Used  . Alcohol Use: No    No Known Allergies  No current outpatient prescriptions on file.   No current facility-administered medications for this visit.    Review of Systems Review of Systems  Constitutional: Negative.   Respiratory: Negative.   Cardiovascular: Negative.     Blood pressure 138/78, pulse 64, resp. rate 12, height 5\' 3"  (1.6 m), weight 183 lb (83.008 kg).  Physical Exam Physical Exam  Constitutional: She is oriented to person, place, and time. She appears well-developed and  well-nourished.  Cardiovascular: Normal rate and regular rhythm.   Pulmonary/Chest: Effort normal and breath sounds normal.  Neurological: She is alert and oriented to person, place, and time.  Skin: Skin is warm and dry.    Data Reviewed  Ultrasound examination of the right breast in the 10:00 position, 11 cm from the nipple with moderate rotation medially showed a multilobulated ill-defined 0.3 x 0.3 x 0.65 cm lesion. The patient was amenable to vacuum biopsy. 10 mL of 0.5% Xylocaine with 0.25% Marcaine with 1-200,000 of epinephrine was utilized well tolerated. ChloraPrep was applied to the skin. A 10-gauge Encor device was placed under ultrasound guidance and the area removed in its entirety. A postbiopsy clip placed.  Ultrasound examination of the left breast was completed to confirm visibility of the previously identified papillary invasive mammary carcinoma in the upper-outer quadrant. At the 1:00 position of the left breast 9 cm from the nipple a well-defined 5-6 mm nodule is identified with an enclosed clip noted. This is located at a depth of 2 cm. This corresponds to the 7 mm lesion noted prior to her recent biopsy. BI-RADS-6.   Assessment    Papillary carcinoma the left breast.  Indeterminate lesion of the right breast.     Plan    The patient will be contacted with the results of today's biopsy when available.  Regarding her known  left breast cancer, the patient desires to proceed with rest conservation therapy. Wide local excision and sentinel node biopsy reviewed. While invasion was not identified on the biopsy specimen, considering the location the upper-outer quadrant solid node biopsy is been recommended. Indication for post surgery radiation therapy discussed.    Patient is scheduled for surgery at [facility] on 05/22/16. She will pre admit by phone. Patient is aware of date and instructions.   PCP:  Margarita Rana This information has been scribed by Gaspar Cola  CMA.    Robert Bellow 05/09/2016, 8:11 PM

## 2016-05-09 NOTE — Patient Instructions (Addendum)
CARE AFTER BREAST BIOPSY  1. Leave the dressing on that your doctor applied after the biopsy. It is waterproof. You may bathe, shower and/or swim. The dressing can be removed in 3 days, you will see small strips of tape against your skin on the incision. Do not remove these strips they will gradually fall off in about 2-3 weeks. You may use an ice pack on and off for the first 12-24 hours for comfort.  2. You may want to use a gauze,cloth or similar protection in your bra to prevent rubbing against your dressing and incision. This is not necessary, but you may feel more comfortable doing so.  3. It is recommended that you wear a bra day and night to give support to the breast. This will prevent the weight of the breast from pulling on the incision.  4. Your breast may feel hard and lumpy under the incision. Do not be alarmed. This is the underlying stitching of tissue. Softening of this tissue will occur in time.  5. You may have a follow up appointment or phone follow up in one week after your biopsy. The office phone number is [phone].  6. You will notice about a week or two after your office visit that the strips of the tape on your incision will begin to loosen. These may then be removed.  7. Report to your doctor any of the following:  * Severe pain not relieved by your pain medication  *Redness of the incision  * Drainage from the incision  *Fever greater than 101 degrees  Patient is scheduled for surgery at [facility] on 05/22/16. She will pre admit by phone. Patient is aware of date and instructions.

## 2016-05-09 NOTE — H&P (Signed)
HPI Theresa Acosta is a 65 y.o. female here today for a right breast biopsy. Review the patient's imaging studies completed after her last visit suggested biopsy was indicated for an area felt to represent a complex cyst prior to treatment of her known left breast cancer.   HPI  Past Medical History  Diagnosis Date  . Personal history of colonic polyps 2007    Past Surgical History  Procedure Laterality Date  . Tubal ligation  1977  . Colonoscopy  2007 ( serated adenoma of the rectum), 2012 (normal)    Dr Bary Castilla  . Bunionectomy  2012  . Colonoscopy with propofol N/A 03/21/2016    Procedure: COLONOSCOPY WITH PROPOFOL;  Surgeon: Robert Bellow, MD;  Location: [facility] ENDOSCOPY;  Service: Endoscopy;  Laterality: N/A;  . Breast biopsy Left 04/12/16    Papillary carcinoma without definite invasion, receptor status deferred to formal excision specimen..    Family History  Problem Relation Age of Onset  . Cancer Neg Hx   . Breast cancer Neg Hx   . Healthy Sister   . Healthy Brother   . Healthy Sister   . Healthy Brother   . Healthy Brother     Social History Social History  Substance Use Topics  . Smoking status: Former Smoker -- 2.00 packs/day for 30 years    Quit date: 02/27/2016  . Smokeless tobacco: Never Used  . Alcohol Use: No    No Known Allergies  No current outpatient prescriptions on file.   No current facility-administered medications for this visit.    Review of Systems Review of Systems  Constitutional: Negative.   Respiratory: Negative.   Cardiovascular: Negative.     Blood pressure 138/78, pulse 64, resp. rate 12, height 5\' 3"  (1.6 m), weight 183 lb (83.008 kg).  Physical Exam Physical Exam  Constitutional: She is oriented to person, place, and time. She appears well-developed and well-nourished.  Cardiovascular: Normal rate and regular rhythm.   Pulmonary/Chest: Effort normal and breath sounds normal.  Neurological: She is alert and oriented to  person, place, and time.  Skin: Skin is warm and dry.    Data Reviewed  Ultrasound examination of the right breast in the 10:00 position, 11 cm from the nipple with moderate rotation medially showed a multilobulated ill-defined 0.3 x 0.3 x 0.65 cm lesion. The patient was amenable to vacuum biopsy. 10 mL of 0.5% Xylocaine with 0.25% Marcaine with 1-200,000 of epinephrine was utilized well tolerated. ChloraPrep was applied to the skin. A 10-gauge Encor device was placed under ultrasound guidance and the area removed in its entirety. A postbiopsy clip placed.  Ultrasound examination of the left breast was completed to confirm visibility of the previously identified papillary invasive mammary carcinoma in the upper-outer quadrant. At the 1:00 position of the left breast 9 cm from the nipple a well-defined 5-6 mm nodule is identified with an enclosed clip noted. This is located at a depth of 2 cm. This corresponds to the 7 mm lesion noted prior to her recent biopsy. [id number].   Assessment    Papillary carcinoma the left breast.  Indeterminate lesion of the right breast.     Plan    The patient will be contacted with the results of today's biopsy when available.  Regarding her known left breast cancer, the patient desires to proceed with rest conservation therapy. Wide local excision and sentinel node biopsy reviewed. While invasion was not identified on the biopsy specimen, considering the location the upper-outer quadrant  solid node biopsy is been recommended. Indication for post surgery radiation therapy discussed.    Patient is scheduled for surgery at [facility] on 05/22/16. She will pre admit by phone. Patient is aware of date and instructions.   PCP:  Margarita Rana This information has been scribed by Gaspar Cola CMA.    Robert Bellow 05/09/2016, 8:11 PM

## 2016-05-10 ENCOUNTER — Telehealth: Payer: Self-pay | Admitting: General Surgery

## 2016-05-10 NOTE — Telephone Encounter (Signed)
Patient notified that the right breast biopsy completed yesterday showed only fibrocystic changes. She reports no pain postprocedure.  We will proceed with planned wide excision of the left breast papillary carcinoma with sentinel node biopsy later in the month as scheduled.

## 2016-05-12 ENCOUNTER — Encounter: Payer: Self-pay | Admitting: *Deleted

## 2016-05-12 ENCOUNTER — Inpatient Hospital Stay: Admission: RE | Admit: 2016-05-12 | Payer: 59 | Source: Ambulatory Visit

## 2016-05-12 NOTE — Patient Instructions (Signed)
  Your procedure is scheduled on: 05-22-16  Report to [place] @ 7:40 PER PT   Remember: Instructions that are not followed completely may result in serious medical risk, up to and including death, or upon the discretion of your surgeon and anesthesiologist your surgery may need to be rescheduled.    _x___ 1. Do not eat food or drink liquids after midnight. No gum chewing or hard candies.     __x__ 2. No Alcohol for 24 hours before or after surgery.   __x__3. No Smoking for 24 prior to surgery.   ____  4. Bring all medications with you on the day of surgery if instructed.    __x__ 5. Notify your doctor if there is any change in your medical condition     (cold, fever, infections).     Do not wear jewelry, make-up, hairpins, clips or nail polish.  Do not wear lotions, powders, or perfumes. You may wear deodorant.  Do not shave 48 hours prior to surgery. Men may shave face and neck.  Do not bring valuables to the hospital.    [facility] is not responsible for any belongings or valuables.               Contacts, dentures or bridgework may not be worn into surgery.  Leave your suitcase in the car. After surgery it may be brought to your room.  For patients admitted to the hospital, discharge time is determined by your treatment team.   Patients discharged the day of surgery will not be allowed to drive home.    Please read over the following fact sheets that you were given:   [facility] Preparing for Surgery and or MRSA Information   ____ Take these medicines the morning of surgery with A SIP OF WATER:    1. NONE  2.  3.  4.  5.  6.  ____ Fleet Enema (as directed)   ____ Use CHG Soap or sage wipes as directed on instruction sheet   ____ Use inhalers on the day of surgery and bring to hospital day of surgery  ____ Stop metformin 2 days prior to surgery    ____ Take 1/2 of usual insulin dose the night before surgery and none on the morning of  surgery.   ____ Stop aspirin or coumadin, or plavix  ___ Stop Anti-inflammatories such as Advil, Aleve, Ibuprofen, Motrin, Naproxen,          Naprosyn, Goodies powders or aspirin products. Ok to take Tylenol.   ____ Stop supplements until after surgery.    ____ Bring C-Pap to the hospital.

## 2016-05-22 ENCOUNTER — Ambulatory Visit: Admission: RE | Admit: 2016-05-22 | Payer: 59 | Source: Ambulatory Visit

## 2016-05-22 MED ORDER — LACTATED RINGERS IV SOLN
INTRAVENOUS | Status: DC
  Administered 2016-05-26 (×2): via INTRAVENOUS

## 2016-05-22 MED ORDER — FAMOTIDINE 20 MG PO TABS: 20.0000 mg | ORAL_TABLET | Freq: Once | ORAL | Status: DC

## 2016-05-22 NOTE — Progress Notes (Signed)
Patient ID: Theresa Acosta, female   DOB: 02-Nov-1950, 65 y.o.   MRN: [id number]  The patient is rescheduled for surgery today due to problems with the OR. The patient is now scheduled for surgery at [facility] on 05/26/16. She will be contacted with an arrival time once her surgery has been posted and her sentinel node injection has been scheduled.

## 2016-05-26 ENCOUNTER — Ambulatory Visit
Admission: RE | Admit: 2016-05-26 | Discharge: 2016-05-26 | Disposition: A | Discharge status: Home / Self Care | Payer: 59 | Source: Ambulatory Visit | Attending: General Surgery | Admitting: General Surgery

## 2016-05-26 ENCOUNTER — Encounter
Admission: RE | Disposition: A | Discharge status: Home / Self Care | Payer: Self-pay | Source: Ambulatory Visit | Attending: General Surgery

## 2016-05-26 ENCOUNTER — Ambulatory Visit: Payer: 59 | Admitting: Anesthesiology

## 2016-05-26 ENCOUNTER — Ambulatory Visit: Payer: 59

## 2016-05-26 ENCOUNTER — Encounter: Payer: Self-pay | Admitting: *Deleted

## 2016-05-26 ENCOUNTER — Encounter
Admission: RE | Admit: 2016-05-26 | Discharge: 2016-05-26 | Disposition: A | Discharge status: Home / Self Care | Payer: 59 | Source: Ambulatory Visit | Attending: General Surgery | Admitting: General Surgery

## 2016-05-26 DIAGNOSIS — C50912 Malignant neoplasm of unspecified site of left female breast: Secondary | ICD-10-CM | POA: Insufficient documentation

## 2016-05-26 DIAGNOSIS — R928 Other abnormal and inconclusive findings on diagnostic imaging of breast: Secondary | ICD-10-CM | POA: Diagnosis not present

## 2016-05-26 DIAGNOSIS — D0512 Intraductal carcinoma in situ of left breast: Secondary | ICD-10-CM | POA: Diagnosis not present

## 2016-05-26 DIAGNOSIS — Z9889 Other specified postprocedural states: Secondary | ICD-10-CM | POA: Insufficient documentation

## 2016-05-26 DIAGNOSIS — Z87891 Personal history of nicotine dependence: Secondary | ICD-10-CM | POA: Insufficient documentation

## 2016-05-26 DIAGNOSIS — C50412 Malignant neoplasm of upper-outer quadrant of left female breast: Secondary | ICD-10-CM | POA: Diagnosis not present

## 2016-05-26 DIAGNOSIS — Z9851 Tubal ligation status: Secondary | ICD-10-CM | POA: Insufficient documentation

## 2016-05-26 DIAGNOSIS — C801 Malignant (primary) neoplasm, unspecified: Secondary | ICD-10-CM

## 2016-05-26 DIAGNOSIS — Z86010 Personal history of colonic polyps: Secondary | ICD-10-CM | POA: Diagnosis not present

## 2016-05-26 DIAGNOSIS — C50812 Malignant neoplasm of overlapping sites of left female breast: Secondary | ICD-10-CM | POA: Diagnosis not present

## 2016-05-26 HISTORY — PX: BREAST LUMPECTOMY WITH NEEDLE LOCALIZATION: SHX5759

## 2016-05-26 HISTORY — PX: BREAST LUMPECTOMY: SHX2

## 2016-05-26 HISTORY — DX: Malignant (primary) neoplasm, unspecified: C80.1

## 2016-05-26 HISTORY — PX: SENTINEL NODE BIOPSY: SHX6608

## 2016-05-26 SURGERY — BREAST LUMPECTOMY WITH NEEDLE LOCALIZATION
Anesthesia: General | Laterality: Left | Wound class: Clean

## 2016-05-26 MED ORDER — DEXAMETHASONE SODIUM PHOSPHATE 10 MG/ML IJ SOLN
INTRAMUSCULAR | Status: DC | PRN
  Administered 2016-05-26: 10 mg via INTRAVENOUS

## 2016-05-26 MED ORDER — FENTANYL CITRATE (PF) 100 MCG/2ML IJ SOLN: 25.0000 ug | INTRAMUSCULAR | Status: DC | PRN

## 2016-05-26 MED ORDER — BUPIVACAINE-EPINEPHRINE (PF) 0.5% -1:200000 IJ SOLN
INTRAMUSCULAR | Status: DC | PRN
  Administered 2016-05-26: 30 mL via PERINEURAL

## 2016-05-26 MED ORDER — BUPIVACAINE-EPINEPHRINE (PF) 0.5% -1:200000 IJ SOLN
INTRAMUSCULAR | Status: AC
  Filled 2016-05-26: qty 30

## 2016-05-26 MED ORDER — METHYLENE BLUE 0.5 % INJ SOLN
INTRAVENOUS | Status: AC
  Filled 2016-05-26: qty 10

## 2016-05-26 MED ORDER — ACETAMINOPHEN 10 MG/ML IV SOLN
INTRAVENOUS | Status: DC | PRN
  Administered 2016-05-26: 1000 mg via INTRAVENOUS

## 2016-05-26 MED ORDER — FENTANYL CITRATE (PF) 100 MCG/2ML IJ SOLN
INTRAMUSCULAR | Status: DC | PRN
  Administered 2016-05-26: 50 ug via INTRAVENOUS
  Administered 2016-05-26: 100 ug via INTRAVENOUS
  Administered 2016-05-26: 50 ug via INTRAVENOUS

## 2016-05-26 MED ORDER — TECHNETIUM TC 99M SULFUR COLLOID
1.0500 | Freq: Once | INTRAVENOUS | Status: AC | PRN
  Administered 2016-05-26: 1.05 via INTRAVENOUS

## 2016-05-26 MED ORDER — LIDOCAINE HCL (CARDIAC) 20 MG/ML IV SOLN
INTRAVENOUS | Status: DC | PRN
  Administered 2016-05-26: 100 mg via INTRAVENOUS

## 2016-05-26 MED ORDER — ACETAMINOPHEN 10 MG/ML IV SOLN
INTRAVENOUS | Status: AC
  Filled 2016-05-26: qty 100

## 2016-05-26 MED ORDER — ONDANSETRON HCL 4 MG/2ML IJ SOLN
INTRAMUSCULAR | Status: DC | PRN
  Administered 2016-05-26: 4 mg via INTRAVENOUS

## 2016-05-26 MED ORDER — MIDAZOLAM HCL 2 MG/2ML IJ SOLN
INTRAMUSCULAR | Status: DC | PRN
  Administered 2016-05-26: 2 mg via INTRAVENOUS

## 2016-05-26 MED ORDER — PROPOFOL 10 MG/ML IV BOLUS
INTRAVENOUS | Status: DC | PRN
  Administered 2016-05-26: 120 mg via INTRAVENOUS

## 2016-05-26 MED ORDER — ONDANSETRON HCL 4 MG/2ML IJ SOLN: 4.0000 mg | Freq: Once | INTRAMUSCULAR | Status: DC | PRN

## 2016-05-26 MED ORDER — PHENYLEPHRINE HCL 10 MG/ML IJ SOLN
INTRAMUSCULAR | Status: DC | PRN
  Administered 2016-05-26 (×5): 100 ug via INTRAVENOUS

## 2016-05-26 MED ORDER — METHYLENE BLUE 0.5 % INJ SOLN
INTRAVENOUS | Status: DC | PRN
  Administered 2016-05-26: 5 mL via SUBMUCOSAL

## 2016-05-26 MED ORDER — KETOROLAC TROMETHAMINE 30 MG/ML IJ SOLN
INTRAMUSCULAR | Status: DC | PRN
  Administered 2016-05-26: 30 mg via INTRAVENOUS

## 2016-05-26 MED ORDER — EPHEDRINE SULFATE 50 MG/ML IJ SOLN
INTRAMUSCULAR | Status: DC | PRN
  Administered 2016-05-26: 10 mg via INTRAVENOUS

## 2016-05-26 MED ORDER — BUPIVACAINE HCL (PF) 0.5 % IJ SOLN
INTRAMUSCULAR | Status: AC
  Filled 2016-05-26: qty 30

## 2016-05-26 MED ORDER — HYDROCODONE-ACETAMINOPHEN 5-325 MG PO TABS: 1.0000 | ORAL_TABLET | ORAL | 0 refills | Status: DC | PRN

## 2016-05-26 SURGICAL SUPPLY — 51 items
BANDAGE ELASTIC 6 LF NS (GAUZE/BANDAGES/DRESSINGS) IMPLANT
BLADE SURG 15 STRL SS SAFETY (BLADE) ×4 IMPLANT
BNDG GAUZE 4.5X4.1 6PLY STRL (MISCELLANEOUS) IMPLANT
BRA SURGICAL LRG (MISCELLANEOUS) ×2 IMPLANT
BULB RESERV EVAC DRAIN JP 100C (MISCELLANEOUS) IMPLANT
CANISTER SUCT 1200ML W/VALVE (MISCELLANEOUS) ×2 IMPLANT
CHLORAPREP W/TINT 26ML (MISCELLANEOUS) ×2 IMPLANT
CNTNR SPEC 2.5X3XGRAD LEK (MISCELLANEOUS)
CONT SPEC 4OZ STER OR WHT (MISCELLANEOUS)
CONTAINER SPEC 2.5X3XGRAD LEK (MISCELLANEOUS) IMPLANT
COVER PROBE FLX POLY STRL (MISCELLANEOUS) ×4 IMPLANT
DEVICE DUBIN SPECIMEN MAMMOGRA (MISCELLANEOUS) ×2 IMPLANT
DRAIN CHANNEL JP 15F RND 16 (MISCELLANEOUS) IMPLANT
DRAPE LAPAROTOMY TRNSV 106X77 (MISCELLANEOUS) ×2 IMPLANT
DRSG TELFA 3X8 NADH (GAUZE/BANDAGES/DRESSINGS) IMPLANT
ELECT CAUTERY BLADE TIP 2.5 (TIP) ×2
ELECT REM PT RETURN 9FT ADLT (ELECTROSURGICAL) ×2
ELECTRODE CAUTERY BLDE TIP 2.5 (TIP) ×1 IMPLANT
ELECTRODE REM PT RTRN 9FT ADLT (ELECTROSURGICAL) ×1 IMPLANT
GAUZE FLUFF 18X24 1PLY STRL (GAUZE/BANDAGES/DRESSINGS) ×2 IMPLANT
GAUZE SPONGE 4X4 12PLY STRL (GAUZE/BANDAGES/DRESSINGS) ×2 IMPLANT
GLOVE BIO SURGEON STRL SZ7.5 (GLOVE) ×4 IMPLANT
GLOVE INDICATOR 8.0 STRL GRN (GLOVE) ×4 IMPLANT
GOWN STRL REUS W/ TWL LRG LVL3 (GOWN DISPOSABLE) ×2 IMPLANT
GOWN STRL REUS W/TWL LRG LVL3 (GOWN DISPOSABLE) ×2
HARMONIC SCALPEL FOCUS (MISCELLANEOUS) IMPLANT
KIT RM TURNOVER STRD PROC AR (KITS) ×2 IMPLANT
LABEL OR SOLS (LABEL) ×2 IMPLANT
MARGIN MAP 10MM (MISCELLANEOUS) ×2 IMPLANT
NDL SAFETY 22GX1.5 (NEEDLE) ×2 IMPLANT
NEEDLE HYPO 25X1 1.5 SAFETY (NEEDLE) ×2 IMPLANT
PACK BASIN MINOR ARMC (MISCELLANEOUS) ×2 IMPLANT
SHEARS FOC LG CVD HARMONIC 17C (MISCELLANEOUS) IMPLANT
SLEVE PROBE SENORX GAMMA FIND (MISCELLANEOUS) ×2 IMPLANT
STRIP CLOSURE SKIN 1/2X4 (GAUZE/BANDAGES/DRESSINGS) ×2 IMPLANT
SUT ETHILON 3-0 FS-10 30 BLK (SUTURE) ×2
SUT SILK 2 0 (SUTURE) ×1
SUT SILK 2-0 18XBRD TIE 12 (SUTURE) ×1 IMPLANT
SUT VIC AB 2-0 CT1 27 (SUTURE) ×1
SUT VIC AB 2-0 CT1 TAPERPNT 27 (SUTURE) ×1 IMPLANT
SUT VIC AB 3-0 SH 27 (SUTURE) ×1
SUT VIC AB 3-0 SH 27X BRD (SUTURE) ×1 IMPLANT
SUT VIC AB 4-0 FS2 27 (SUTURE) ×2 IMPLANT
SUT VICRYL+ 3-0 144IN (SUTURE) ×2 IMPLANT
SUTURE EHLN 3-0 FS-10 30 BLK (SUTURE) ×1 IMPLANT
SWABSTK COMLB BENZOIN TINCTURE (MISCELLANEOUS) ×2 IMPLANT
SYR BULB IRRIG 60ML STRL (SYRINGE) ×2 IMPLANT
SYR CONTROL 10ML (SYRINGE) ×2 IMPLANT
SYRINGE 10CC LL (SYRINGE) ×2 IMPLANT
TAPE TRANSPORE STRL 2 31045 (GAUZE/BANDAGES/DRESSINGS) IMPLANT
WATER STERILE IRR 1000ML POUR (IV SOLUTION) ×2 IMPLANT

## 2016-05-26 NOTE — Discharge Instructions (Signed)

## 2016-05-26 NOTE — Transfer of Care (Signed)
Immediate Anesthesia Transfer of Care Note  Patient: Theresa Acosta  Procedure(s) Performed: Procedure(s): BREAST LUMPECTOMY WITH SENTINEL LYMPH NODE BX and needle localization (Left) SENTINEL NODE BIOPSY (Left)  Patient Location: PACU  Anesthesia Type:General  Level of Consciousness: awake and sedated  Airway & Oxygen Therapy: Patient Spontanous Breathing and Patient connected to nasal cannula oxygen  Post-op Assessment: Report given to RN and Post -op Vital signs reviewed and stable  Post vital signs: Reviewed and stable  Last Vitals:  Vitals:   05/26/16 0942  BP: (!) 157/57  Pulse: 65  Resp: 16  Temp: 36.7 C    Last Pain:  Vitals:   05/26/16 0942  TempSrc: Oral      Patients Stated Pain Goal: 2 ([id number])  Complications: No apparent anesthesia complications

## 2016-05-26 NOTE — OR Nursing (Signed)
Dr. Byrnett in to see patient. 

## 2016-05-26 NOTE — Op Note (Signed)
Preoperative diagnosis: Left breast cancer. Postoperative diagnosis: Same.  Operative procedure: Left breast wide excision with sentinel node biopsy.  Operating surgeon: Lynnette Caffey, M.D.  Anesthesia: Gen. by LMA, Marcaine 0.5% with 1-200,000 units of epinephrine, 30 cc local infiltration.  Estimated blood loss: Less than 10 cc.  Clinical note: This 65 year old was identified with a new nodule in the left breast and biopsy showed evidence of a papillary carcinoma. As the planned incision would be in the upper outer quadrant and the possibility of identifying a sentinel node in the future might be compromised it was elected to complete a sentinel node biopsy at this time even though definite invasion was not noted on the original biopsy.  The patient had an ultrasound in the office in the area of concern had been identified using the ultrasound in the day surgery area could not be seen. Dr. Shelly Bombard from radiology was kind enough to do an ultrasound-guided localization in the radiology department. The patient was injected with technetium sulfur colloid prior to presentation of the operating room.  Operative note: With the patient under adequate general anesthesia small roll behind the shoulder 5 cc of 0.5% methylene blue was injected subareolar plexus. The breast was then taped inferiorly medially to provide better exposure to the upper outer quadrant. Ultrasound was used to confirm the path of the wire and local anesthetic was infiltrated. A curvilinear incision in the upper outer quadrant was made and carried at the skin and subcutaneous anesthesia with hemostasis achieved by electrocautery. The wire was identified and brought into the wound. A 4 x 4 by 5 cm block of tissue was excised orientated and specimen radiograph confirmed the wire tip was intact and the previously placed clip was removed. This was confirmed on pathologic review. Attention was then turned towards the axilla. The node seeker  device was utilized and it was elected to approach the axilla from the wide excision site. Approximately 9 cm later a single small 6 mm hot, blue node was identified in the apex of the axilla. This was sent in formalin for routine histology. Good hemostasis was noted. The wound was approximated with interrupted 2-0 Vicryl figure-of-eight sutures at the base of the adipose layer. The superficial adipose tissue was approximated in similar fashion. Skin was closed with a running 4-0 Vicryl subcuticular suture. Benzoin and Steri-Strips were applied followed by a Telfa pad, fluff gauze and a surgical bra.  The patient tolerated the procedure well and was taken to recovery room in stable condition.

## 2016-05-26 NOTE — Anesthesia Postprocedure Evaluation (Signed)
Anesthesia Post Note  Patient: Theresa Acosta  Procedure(s) Performed: Procedure(s) (LRB): BREAST LUMPECTOMY WITH SENTINEL LYMPH NODE BX and needle localization (Left) SENTINEL NODE BIOPSY (Left)  Patient location during evaluation: PACU Anesthesia Type: General Level of consciousness: awake and alert and oriented Pain management: pain level controlled Vital Signs Assessment: post-procedure vital signs reviewed and stable Respiratory status: spontaneous breathing Cardiovascular status: blood pressure returned to baseline Anesthetic complications: no    Last Vitals:  Vitals:   05/26/16 1434 05/26/16 1446  BP: 139/75 (!) 144/66  Pulse: 68 77  Resp: 17 (!) 22  Temp:  36.2 C    Last Pain:  Vitals:   05/26/16 1446  TempSrc:   PainSc: 3                  Jobe Mutch

## 2016-05-26 NOTE — H&P (Signed)
Theresa Acosta [id number] [birth year]     HPI: 65 y/o with papillary carcinoma of the left breast. For wide excision and SLN biopsy.   No prescriptions prior to admission.   No Known Allergies Past Medical History:  Diagnosis Date  . Cancer ([name])   . Personal history of colonic polyps 2007   Past Surgical History:  Procedure Laterality Date  . 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  . BUNIONECTOMY  2012  . COLONOSCOPY  2007 ( serated adenoma of the rectum), 2012 (normal)   Dr Bary Castilla  . COLONOSCOPY WITH PROPOFOL N/A 03/21/2016   Procedure: COLONOSCOPY WITH PROPOFOL;  Surgeon: Robert Bellow, MD;  Location: [facility] ENDOSCOPY;  Service: Endoscopy;  Laterality: N/A;  . TUBAL LIGATION  1977   Social History   Social History  . Marital status: Divorced    Spouse name: N/A  . Number of children: 2  . Years of education: N/A   Occupational History  . Not on file.   Social History Main Topics  . Smoking status: Former Smoker    Packs/day: 2.00    Years: 30.00    Quit date: 02/27/2016  . Smokeless tobacco: Never Used  . Alcohol use No  . Drug use: No  . Sexual activity: Not on file   Other Topics Concern  . Not on file   Social History Narrative  . No narrative on file   Social History   Social History Narrative  . No narrative on file     ROS: Negative.     PE: HEENT: Negative. Lungs: Clear. Cardio: RR. Breast: U/S in SDS: Area not visible with OR equipment.   Assessment/Plan:  Discussed with Dr. Shelly Bombard in radiology. She will complete U/S needle localization and patient will then go to OR for surgical excision.    Robert Bellow 05/26/2016

## 2016-05-26 NOTE — Anesthesia Procedure Notes (Signed)
Procedure Name: LMA Insertion Performed by: Nelda Marseille Pre-anesthesia Checklist: Patient identified, Patient being monitored, Timeout performed, Emergency Drugs available and Suction available Patient Re-evaluated:Patient Re-evaluated prior to inductionOxygen Delivery Method: Circle system utilized Preoxygenation: Pre-oxygenation with 100% oxygen Intubation Type: IV induction Ventilation: Mask ventilation without difficulty LMA: LMA inserted LMA Size: 3.5 Tube type: Oral Number of attempts: 1 Placement Confirmation: positive ETCO2 and breath sounds checked- equal and bilateral Tube secured with: Tape Dental Injury: Teeth and Oropharynx as per pre-operative assessment

## 2016-05-26 NOTE — Anesthesia Preprocedure Evaluation (Signed)
Anesthesia Evaluation  Patient identified by MRN, date of birth, ID band Patient awake    Reviewed: Allergy & Precautions, NPO status , Patient's Chart, lab work & pertinent test results  Airway Mallampati: III  TM Distance: <3 FB     Dental  (+) Chipped, Caps   Pulmonary former smoker,    Pulmonary exam normal        Cardiovascular negative cardio ROS Normal cardiovascular exam     Neuro/Psych negative neurological ROS  negative psych ROS   GI/Hepatic Neg liver ROS, Colon polyps   Endo/Other  negative endocrine ROS  Renal/GU negative Renal ROS  negative genitourinary   Musculoskeletal negative musculoskeletal ROS (+)   Abdominal Normal abdominal exam  (+)   Peds negative pediatric ROS (+)  Hematology   Anesthesia Other Findings Breast CA  Reproductive/Obstetrics                            Anesthesia Physical Anesthesia Plan  ASA: II  Anesthesia Plan: General   Post-op Pain Management:    Induction: Intravenous  Airway Management Planned: LMA  Additional Equipment:   Intra-op Plan:   Post-operative Plan: Extubation in OR  Informed Consent: I have reviewed the patients History and Physical, chart, labs and discussed the procedure including the risks, benefits and alternatives for the proposed anesthesia with the patient or authorized representative who has indicated his/her understanding and acceptance.   Dental advisory given  Plan Discussed with: CRNA and Surgeon  Anesthesia Plan Comments:         Anesthesia Quick Evaluation

## 2016-05-29 ENCOUNTER — Encounter: Payer: Self-pay | Admitting: General Surgery

## 2016-05-30 ENCOUNTER — Telehealth: Payer: Self-pay | Admitting: General Surgery

## 2016-05-30 NOTE — Telephone Encounter (Signed)
Patient notified path fine. F/u as scheduled tomorrow to discuss RT.

## 2016-05-31 ENCOUNTER — Other Ambulatory Visit: Payer: Self-pay

## 2016-05-31 ENCOUNTER — Ambulatory Visit (INDEPENDENT_AMBULATORY_CARE_PROVIDER_SITE_OTHER): Payer: 59 | Admitting: General Surgery

## 2016-05-31 ENCOUNTER — Encounter: Payer: Self-pay | Admitting: General Surgery

## 2016-05-31 VITALS — BP 128/74 | HR 76 | Resp 12 | Ht 63.0 in | Wt 183.0 lb

## 2016-05-31 DIAGNOSIS — C50412 Malignant neoplasm of upper-outer quadrant of left female breast: Secondary | ICD-10-CM

## 2016-05-31 DIAGNOSIS — C50912 Malignant neoplasm of unspecified site of left female breast: Secondary | ICD-10-CM

## 2016-05-31 NOTE — Progress Notes (Signed)
Patient ID: Theresa Acosta, female   DOB: 05/01/1951, 65 y.o.   MRN: [id number]  Chief Complaint  Patient presents with  . Routine Post Op    HPI Theresa Acosta is a 65 y.o. female here today for her post op left breast lumpectomy done on 05/26/16. Patient states she is doing well.  HPI  Past Medical History:  Diagnosis Date  . Cancer ([place])   . Personal history of colonic polyps 2007    Past Surgical History:  Procedure Laterality Date  . 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  . BREAST LUMPECTOMY WITH NEEDLE LOCALIZATION Left 05/26/2016   Procedure: BREAST LUMPECTOMY WITH SENTINEL LYMPH NODE BX and needle localization;  Surgeon: Robert Bellow, MD;  Location: [facility] ORS;  Service: General;  Laterality: Left;  . BUNIONECTOMY  2012  . COLONOSCOPY  2007 ( serated adenoma of the rectum), 2012 (normal)   Dr Bary Castilla  . COLONOSCOPY WITH PROPOFOL N/A 03/21/2016   Procedure: COLONOSCOPY WITH PROPOFOL;  Surgeon: Robert Bellow, MD;  Location: [facility] ENDOSCOPY;  Service: Endoscopy;  Laterality: N/A;  . SENTINEL NODE BIOPSY Left 05/26/2016   Procedure: SENTINEL NODE BIOPSY;  Surgeon: Robert Bellow, MD;  Location: [facility] ORS;  Service: General;  Laterality: Left;  . TUBAL LIGATION  1977    Family History  Problem Relation Age of Onset  . Healthy Sister   . Healthy Brother   . Healthy Sister   . Healthy Brother   . Healthy Brother   . Cancer Neg Hx   . Breast cancer Neg Hx     Social History Social History  Substance Use Topics  . Smoking status: Former Smoker    Packs/day: 2.00    Years: 30.00    Quit date: 02/27/2016  . Smokeless tobacco: Never Used  . Alcohol use No    No Known Allergies  No current outpatient prescriptions on file.   No current facility-administered medications for this visit.     Review of Systems Review of Systems  Constitutional: Negative.    Respiratory: Negative.   Cardiovascular: Negative.     Blood pressure 128/74, pulse 76, resp. rate 12, height 5\' 3"  (1.6 m), weight 183 lb (83 kg).  Physical Exam Physical Exam  Constitutional: She is oriented to person, place, and time. She appears well-developed and well-nourished.  Eyes: Conjunctivae are normal. No scleral icterus.  Cardiovascular: Normal rate, regular rhythm and normal heart sounds.   Pulmonary/Chest: Effort normal and breath sounds normal. Left breast exhibits no inverted nipple, no nipple discharge, no skin change and no tenderness.    Left breast incision is clean and healing well.   Lymphadenopathy:    She has no cervical adenopathy.  Neurological: She is alert and oriented to person, place, and time.  Skin: Skin is warm and dry.    Data Reviewed Pathology showed a 5 mm, T1a, N0 invasive mammary carcinoma. Hormone receptor status pending.  Ultrasound examination of the left breast was done to sew assess patient's candidacy for accelerated partial breast radiation. There is a well-defined seroma cavity measuring 1.8 x 2.6 x 6.5 cm with a minimum distance to the skin of 1.67 cm. Adequate for balloon placement.  Assessment    Stage I carcinoma the left breast.    Plan    Arrangements will be made for evaluation with radiation oncology. Pros and cons of accelerated partial breast radiation or whole breast radiation  reviewed. Final decision to be made by the patient and Dr. Baruch Gouty.  Patient will continue to wear her bra as long as the breast feels heavy. She may begin resuming activities as she is comfortable.  Indication for antiestrogen therapy on hold pending receptor status.  We will send a sample for Mammoprint and blue print testing to determine if she is low risk for recurrent disease.     This information has been scribed by Gaspar Cola CMA.  Robert Bellow 05/31/2016, 11:51 AM

## 2016-05-31 NOTE — Patient Instructions (Signed)
Patient see DR. Chrystal.

## 2016-06-06 ENCOUNTER — Ambulatory Visit
Admission: RE | Admit: 2016-06-06 | Discharge: 2016-06-06 | Disposition: A | Discharge status: Home / Self Care | Payer: 59 | Source: Ambulatory Visit | Attending: Radiation Oncology | Admitting: Radiation Oncology

## 2016-06-06 ENCOUNTER — Encounter: Payer: Self-pay | Admitting: *Deleted

## 2016-06-06 ENCOUNTER — Encounter: Payer: Self-pay | Admitting: Radiation Oncology

## 2016-06-06 ENCOUNTER — Encounter (INDEPENDENT_AMBULATORY_CARE_PROVIDER_SITE_OTHER): Payer: Self-pay

## 2016-06-06 VITALS — BP 140/78 | HR 63 | Temp 98.0°F | Wt 183.6 lb

## 2016-06-06 DIAGNOSIS — Z87891 Personal history of nicotine dependence: Secondary | ICD-10-CM | POA: Insufficient documentation

## 2016-06-06 DIAGNOSIS — Z171 Estrogen receptor negative status [ER-]: Secondary | ICD-10-CM | POA: Insufficient documentation

## 2016-06-06 DIAGNOSIS — Z86010 Personal history of colonic polyps: Secondary | ICD-10-CM | POA: Diagnosis not present

## 2016-06-06 DIAGNOSIS — C50412 Malignant neoplasm of upper-outer quadrant of left female breast: Secondary | ICD-10-CM | POA: Diagnosis not present

## 2016-06-06 DIAGNOSIS — Z510 Encounter for antineoplastic radiation therapy: Secondary | ICD-10-CM | POA: Insufficient documentation

## 2016-06-06 NOTE — Consult Note (Signed)
Except an outstanding is perfect of Radiation Oncology NEW PATIENT EVALUATION  Name: Theresa Acosta  MRN: [id number]  Date:   06/06/2016     DOB: 12-Jan-1951   This 65 y.o. 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  REFERRING PHYSICIAN: Margarita Rana, MD  CHIEF COMPLAINT:  Chief Complaint  Patient presents with  . Breast Cancer    initial cosult    DIAGNOSIS: The encounter diagnosis was Malignant neoplasm of upper-outer quadrant of left female breast ([place]).   PREVIOUS INVESTIGATIONS:  Mammogram and ultrasound reviewed Pathology report reviewed Clinical notes reviewed  HPI: Patient is a 65 year old female who presented with bilateral at mammographic abnormalities. Right breast showed a probably benign lesion at 9:30 which may represent a comp acute cyst in 6 month follow-up mammogram has been recommended. Left breast shows a solid new complex mass at 1:00 position which ultrasound-guided biopsy was recommended. Biopsy was performed and positive for a 4.8 mm invasive mammary carcinoma overall grade 3. Tumor was ER/PR negative HER-2/neu 2+ with fish pending. MammaPrint is also been ordered. She has done well postoperatively. Her margins were clear at 2.7 mm. One sentinel lymph node was negative for metastatic disease. Again she is done well postoperatively seen today for radiation oncology opinion. She specifically denies breast tenderness cough or bone pain.  PLANNED TREATMENT REGIMEN: Possible accelerated partial breast radiation  PAST MEDICAL HISTORY:  has a past medical history of Cancer ([place]) and Personal history of colonic polyps (2007).    PAST SURGICAL HISTORY:  Past Surgical History:  Procedure Laterality Date  . 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  . BREAST LUMPECTOMY WITH NEEDLE LOCALIZATION Left 05/26/2016   Procedure: BREAST LUMPECTOMY WITH SENTINEL LYMPH NODE BX and needle localization;  Surgeon: Robert Bellow, MD;  Location: [facility] ORS;  Service: General;  Laterality: Left;  . BUNIONECTOMY  2012  . COLONOSCOPY  2007 ( serated adenoma of the rectum), 2012 (normal)   Dr Bary Castilla  . COLONOSCOPY WITH PROPOFOL N/A 03/21/2016   Procedure: COLONOSCOPY WITH PROPOFOL;  Surgeon: Robert Bellow, MD;  Location: [facility] ENDOSCOPY;  Service: Endoscopy;  Laterality: N/A;  . SENTINEL NODE BIOPSY Left 05/26/2016   Procedure: SENTINEL NODE BIOPSY;  Surgeon: Robert Bellow, MD;  Location: [facility] ORS;  Service: General;  Laterality: Left;  . TUBAL LIGATION  1977    FAMILY HISTORY: family history includes Healthy in her brother, brother, brother, sister, and sister.  SOCIAL HISTORY:  reports that she quit smoking about 3 months ago. She has a 60.00 pack-year smoking history. She has never used smokeless tobacco. She reports that she does not drink alcohol or use drugs.  ALLERGIES: Review of patient's allergies indicates no known allergies.  MEDICATIONS:  No current outpatient prescriptions on file.   No current facility-administered medications for this encounter.     ECOG PERFORMANCE STATUS:  0 - Asymptomatic  REVIEW OF SYSTEMS:  Patient denies any weight loss, fatigue, weakness, fever, chills or night sweats. Patient denies any loss of vision, blurred vision. Patient denies any ringing  of the ears or hearing loss. No irregular heartbeat. Patient denies heart murmur or history of fainting. Patient denies any chest pain or pain radiating to her upper extremities. Patient denies any shortness of breath, difficulty breathing at night,  cough or hemoptysis. Patient denies any swelling in the lower legs. Patient denies any nausea vomiting, vomiting of blood, or coffee ground material in the vomitus. Patient denies any stomach  pain. Patient states has had normal bowel movements no significant constipation or diarrhea. Patient denies any dysuria, hematuria or significant nocturia. Patient denies any problems walking, swelling in the joints or loss of balance. Patient denies any skin changes, loss of hair or loss of weight. Patient denies any excessive worrying or anxiety or significant depression. Patient denies any problems with insomnia. Patient denies excessive thirst, polyuria, polydipsia. Patient denies any swollen glands, patient denies easy bruising or easy bleeding. Patient denies any recent infections, allergies or URI. Patient "s visual fields have not changed significantly in recent time.    PHYSICAL EXAM: BP 140/78   Pulse 63   Temp 98 F (36.7 C)   Wt 183 lb 10.3 oz (83.3 kg)   BMI 32.53 kg/m  She status post wide local excision of the left breast which is healed well. No dominant mass or nodularity is noted in either breast in 2 positions examined. No axillary or supraclavicular adenopathy is appreciated. Well-developed well-nourished patient in NAD. HEENT reveals PERLA, EOMI, discs not visualized.  Oral cavity is clear. No oral mucosal lesions are identified. Neck is clear without evidence of cervical or supraclavicular adenopathy. Lungs are clear to A&P. Cardiac examination is essentially unremarkable with regular rate and rhythm without murmur rub or thrill. Abdomen is benign with no organomegaly or masses noted. Motor sensory and DTR levels are equal and symmetric in the upper and lower extremities. Cranial nerves II through XII are grossly intact. Proprioception is intact. No peripheral adenopathy or edema is identified. No motor or sensory levels are noted. Crude visual fields are within normal range.  LABORATORY DATA: Pathology reports reviewed    RADIOLOGY RESULTS: Mammograms and ultrasound reviewed   IMPRESSION: Stage I invasive mammary carcinoma the left breast as was wide local excision and  sentinel node biopsy in 65 year old female excellent candidate for accelerated partial breast radiation.  PLAN: At this time based on the small tumor clear margins patient would be an excellent candidate for accelerated partial breast radiation. I would plan on after MammoSite balloon is placed to deliver 3400 cGy in 10 fractions at 340 C twice a day using high dose rate remote afterloading. Risks and benefits of both whole breast radiation as well as accelerated partial breast radiation were reviewed with the patient. Nurse navigator was present. If patient can have balloon placed or is suboptimal configuration would go to whole breast radiation. Also pending or MammaPrint as well as fish for her HER-2 new status. If we do accelerated partial breast radiation even if she needs chemotherapy we can complete that prior to initiating that regiment. All of this was explained in detail to the patient. She seems to comprehend my treatment plan well. We will coordinate with surgeon's office balloon catheter placement as well as follow-up BrachyVision treatment planning.  I would like to take this opportunity to thank you for allowing me to participate in the care of your patient.Armstead Peaks., MD

## 2016-06-06 NOTE — Progress Notes (Signed)
  Oncology Nurse Navigator Documentation  Navigator Location: [facility] (06/06/16 1500) Navigator Encounter Type: Initial RadOnc (06/06/16 1500)       Surgery Date: 05/26/16 (06/06/16 1500) Treatment Initiated Date: 05/26/16 (06/06/16 1500) Patient Visit Type: RadOnc (06/06/16 1500) Treatment Phase: Pre-Tx/Tx Discussion (06/06/16 1500)                  Acuity: Level 2 (06/06/16 1500)         Time Spent with Patient: 60 (06/06/16 1500)   Met patient and her friend today during her initial radiation oncology consult with Dr. Baruch Gouty.  Patient is possibly a candidate for mammosite.  Offered support.  She is to call if she has any questions or needs.

## 2016-06-08 LAB — SURGICAL PATHOLOGY

## 2016-06-13 ENCOUNTER — Telehealth: Payer: Self-pay | Admitting: *Deleted

## 2016-06-13 MED ORDER — CEFADROXIL 500 MG PO CAPS: 500.0000 mg | ORAL_CAPSULE | Freq: Two times a day (BID) | ORAL | 0 refills | Status: DC

## 2016-06-13 NOTE — Telephone Encounter (Signed)
Mammosite schedule reviewed with the patient Placement  06-26-16          at [facility] at 8:30 Scan 06-28-16 Treat 06-29-16, 9-5 through 9-8 Aware the [place] will be calling her for more details Aware of ATB and directions reviewed. Aware no showers and to wear her bra while mammosite in place. Pt agrees.

## 2016-06-14 ENCOUNTER — Telehealth: Payer: Self-pay | Admitting: *Deleted

## 2016-06-14 NOTE — Telephone Encounter (Signed)
Appointment for 06-15-16 at 10:00, pt agrees.

## 2016-06-14 NOTE — Telephone Encounter (Signed)
-----   Message from Robert Bellow, MD sent at 06/14/2016  7:17 AM EDT ----- I would like to see the patient this week to review Mammoprint results.  Thanks. 15 minutes will be fine.

## 2016-06-15 ENCOUNTER — Ambulatory Visit (INDEPENDENT_AMBULATORY_CARE_PROVIDER_SITE_OTHER): Payer: 59 | Admitting: General Surgery

## 2016-06-15 ENCOUNTER — Encounter: Payer: Self-pay | Admitting: General Surgery

## 2016-06-15 VITALS — BP 138/82 | HR 80 | Resp 12 | Ht 63.0 in | Wt 186.0 lb

## 2016-06-15 DIAGNOSIS — C50412 Malignant neoplasm of upper-outer quadrant of left female breast: Secondary | ICD-10-CM

## 2016-06-15 NOTE — Progress Notes (Signed)
Patient ID: Theresa Acosta, female   DOB: June 01, 1951, 65 y.o.   MRN: [id number]  Chief Complaint  Patient presents with  . Follow-up    results    HPI Theresa Acosta is a 65 y.o. female.  Here today to discuss mammoprint results.  She slept under the fan last night and has been sneezing this morning.   HPI  Past Medical History:  Diagnosis Date  . Cancer ([place])   . Personal history of colonic polyps 2007    Past Surgical History:  Procedure Laterality Date  . 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  . BREAST LUMPECTOMY WITH NEEDLE LOCALIZATION Left 05/26/2016   Procedure: BREAST LUMPECTOMY WITH SENTINEL LYMPH NODE BX and needle localization;  Surgeon: Robert Bellow, MD;  Location: [facility] ORS;  Service: General;  Laterality: Left;  . BUNIONECTOMY  2012  . COLONOSCOPY  2007 ( serated adenoma of the rectum), 2012 (normal)   Dr Bary Castilla  . COLONOSCOPY WITH PROPOFOL N/A 03/21/2016   Procedure: COLONOSCOPY WITH PROPOFOL;  Surgeon: Robert Bellow, MD;  [facility] END[facility]COPY;  Service: Endoscopy;  Laterality: N/A;  . SENTINEL NODE BIOPSY Left 05/26/2016   Procedure: SENTINEL NODE BIOPSY;  Surgeon: Robert Bellow, MD;  Location: [facility] ORS;  Service: General;  Laterality: Left;  . TUBAL LIGATION  1977    Family History  Problem Relation Age of Onset  . Healthy Sister   . Healthy Brother   . Healthy Sister   . Healthy Brother   . Healthy Brother   . Cancer Neg Hx   . Breast cancer Neg Hx     Social History Social History  Substance Use Topics  . Smoking status: Former Smoker    Packs/day: 2.00    Years: 30.00    Quit date: 02/27/2016  . Smokeless tobacco: Never Used  . Alcohol use No    No Known Allergies  Current Outpatient Prescriptions  Medication Sig Dispense Refill  . cefadroxil (DURICEF) 500 MG capsule Take 1 capsule (500 mg total) by mouth 2 (two) times daily.  Start one hour before office procedure on 06-26-16 (Patient not taking: Reported on 06/15/2016) 24 capsule 0   No current facility-administered medications for this visit.     Review of Systems Review of Systems  Constitutional: Negative.   Respiratory: Negative.   Cardiovascular: Negative.     Blood pressure 138/82, pulse 80, resp. rate 12, height 5\' 3"  (1.6 m), weight 186 lb (84.4 kg).  Physical Exam Physical Exam  Data Reviewed Mammoprint results were reviewed. High-risk for recurrent disease without adjuvant treatment.  NCCN guidelines did not recommend adjuvant therapy.  Assessment    Doing well status post wide excision, sentinel node biopsy. Pending accelerated partial breast radiation.    Plan    Indications for medical oncology assessment reviewed. Patient is amenable.    Recommend medical oncologist appointment. The patient is scheduled to see Dr Rogue Bussing at [facility] in [place] on 06/27/16 at 1:00 pm. The patient is aware of date and time.  This information has been scribed by Karie Fetch RN, BSN,BC.   Robert Bellow 06/16/2016, 8:40 PM

## 2016-06-15 NOTE — Patient Instructions (Addendum)
The patient is aware to call back for any questions or concerns. Recommend medical oncologist appointment.  The patient is scheduled to see Dr Rogue Bussing at [facility] in [place] on 06/27/16 at 1:00 pm. The patient is aware of date and time.

## 2016-06-20 ENCOUNTER — Encounter: Payer: Self-pay | Admitting: General Surgery

## 2016-06-26 ENCOUNTER — Ambulatory Visit (INDEPENDENT_AMBULATORY_CARE_PROVIDER_SITE_OTHER): Payer: 59

## 2016-06-26 ENCOUNTER — Ambulatory Visit: Payer: 59 | Admitting: General Surgery

## 2016-06-26 ENCOUNTER — Encounter: Payer: Self-pay | Admitting: General Surgery

## 2016-06-26 VITALS — BP 142/82 | HR 76 | Resp 12 | Ht 63.0 in | Wt 183.0 lb

## 2016-06-26 DIAGNOSIS — C50412 Malignant neoplasm of upper-outer quadrant of left female breast: Secondary | ICD-10-CM

## 2016-06-26 HISTORY — PX: BREAST MAMMOSITE: SHX5264

## 2016-06-26 NOTE — Progress Notes (Addendum)
Patient ID: Theresa Acosta, female   DOB: [birth year], 65 y.o.   MRN: [id number]  Chief Complaint  Patient presents with  . Procedure    left mammosite    HPI Theresa Acosta is a 65 y.o. female here today for left mammosite placement. HPI  Past Medical History:  Diagnosis Date  . Cancer ([place])   . Personal history of colonic polyps 2007    Past Surgical History:  Procedure Laterality Date  . 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  . BREAST LUMPECTOMY WITH NEEDLE LOCALIZATION Left 05/26/2016   Procedure: BREAST LUMPECTOMY WITH SENTINEL LYMPH NODE BX and needle localization;  Surgeon: Robert Bellow, MD;  Location: [facility] ORS;  Service: General;  Laterality: Left;  . BUNIONECTOMY  2012  . COLONOSCOPY  2007 ( serated adenoma of the rectum), 2012 (normal)   Dr Bary Castilla  . COLONOSCOPY WITH PROPOFOL N/A 03/21/2016   Procedure: COLONOSCOPY WITH PROPOFOL;  Surgeon: Robert Bellow, MD;  Location: [facility] ENDOSCOPY;  Service: Endoscopy;  Laterality: N/A;  . SENTINEL NODE BIOPSY Left 05/26/2016   Procedure: SENTINEL NODE BIOPSY;  Surgeon: Robert Bellow, MD;  Location: [facility] ORS;  Service: General;  Laterality: Left;  . TUBAL LIGATION  1977    Family History  Problem Relation Age of Onset  . Healthy Sister   . Healthy Brother   . Healthy Sister   . Healthy Brother   . Healthy Brother   . Cancer Neg Hx   . Breast cancer Neg Hx     Social History Social History  Substance Use Topics  . Smoking status: Former Smoker    Packs/day: 2.00    Years: 30.00    Quit date: 02/27/2016  . Smokeless tobacco: Never Used  . Alcohol use No    No Known Allergies  Current Outpatient Prescriptions  Medication Sig Dispense Refill  . cefadroxil (DURICEF) 500 MG capsule Take 1 capsule (500 mg total) by mouth 2 (two) times daily. Start one hour before office procedure on 06-26-16 24 capsule 0   No  current facility-administered medications for this visit.     Review of Systems Review of Systems  Constitutional: Negative.   Respiratory: Negative.   Cardiovascular: Negative.     Blood pressure (!) 142/82, pulse 76, resp. rate 12, height 5\' 3"  (1.6 m), weight 183 lb (83 kg).  Physical Exam Physical Exam  Constitutional: She is oriented to person, place, and time. She appears well-developed and well-nourished.  Pulmonary/Chest:    Neurological: She is alert and oriented to person, place, and time.  Skin: Skin is warm and dry.    Data Reviewed Ultrasound confirmed adequate spacing for balloon placement.  Assessment    Candidate for accelerated partial breast radiation.    Plan    The procedure was reviewed. The patient was amenable to proceed. The breast was prepped with alcohol and 10 mL of 0.5% Xylocaine with 0.25% Marcaine with 1-200,000 epinephrine was instilled. The area was then prepped with ChloraPrep and draped. An incision was made followed by the 8 mm trocar. The cavity evaluation device had been pretested and easily inflated and desufflated with a spherical balloon. Once was placed in the patient however inflation became difficult after 2 mL of instilled. The balloon was worked back and the valve was nonfunctioning. The cavity evaluation device was set aside to return to the manufacturer.  The treatment balloon easily inflated and deflated to  60 mL. The balloon was deflated and placed in the position 9 cm from the skin. It was inflated with 40 mL of saline to which Omnipaque was added. Minimal spacing on transverse and longitudinal imaging was 1.4 cm from the skin. A spherical insufflation noted. The balloon did extend to the pectoralis fascia. No air pockets were evident. Bacitracin ointment was placed on the exit site followed by bulky dressing.  The patient tolerated the procedure well and was discharged home with written instructions.  She will present for  pretreatment scanning on Wednesday, August 30 and we'll plan for return visit here in 2 weeks.  An appointment has been made with medical oncology for their review of the data generated to date, particularly to comment whether the high Mammoprint score necessitates additional treatment outside of institution of an aromatase inhibitor.  This information has been scribed by Gaspar Cola CMA.   Robert Bellow 06/26/2016, 9:25 AM  Dictation #1 [id number]  [id number]

## 2016-06-27 ENCOUNTER — Inpatient Hospital Stay: Payer: 59

## 2016-06-27 ENCOUNTER — Inpatient Hospital Stay: Payer: 59 | Attending: Family Medicine | Admitting: Internal Medicine

## 2016-06-27 VITALS — BP 139/78 | HR 72 | Temp 98.1°F | Resp 18 | Ht 63.0 in | Wt 184.5 lb

## 2016-06-27 DIAGNOSIS — C50412 Malignant neoplasm of upper-outer quadrant of left female breast: Secondary | ICD-10-CM | POA: Diagnosis not present

## 2016-06-27 DIAGNOSIS — Z79899 Other long term (current) drug therapy: Secondary | ICD-10-CM | POA: Diagnosis not present

## 2016-06-27 DIAGNOSIS — Z87891 Personal history of nicotine dependence: Secondary | ICD-10-CM | POA: Diagnosis not present

## 2016-06-27 DIAGNOSIS — Z171 Estrogen receptor negative status [ER-]: Secondary | ICD-10-CM | POA: Diagnosis not present

## 2016-06-27 DIAGNOSIS — T451X5A Adverse effect of antineoplastic and immunosuppressive drugs, initial encounter: Secondary | ICD-10-CM

## 2016-06-27 DIAGNOSIS — R110 Nausea: Secondary | ICD-10-CM

## 2016-06-27 LAB — CBC WITH DIFFERENTIAL/PLATELET
BASOS PCT: 1 %
Basophils Absolute: 0.1 10*3/uL (ref 0–0.1)
EOS ABS: 0.3 10*3/uL (ref 0–0.7)
EOS PCT: 4 %
HCT: 41.8 % (ref 35.0–47.0)
Hemoglobin: 13.3 g/dL (ref 12.0–16.0)
LYMPHS ABS: 2.4 10*3/uL (ref 1.0–3.6)
Lymphocytes Relative: 33 %
MCH: 25.6 pg — AB (ref 26.0–34.0)
MCHC: 31.8 g/dL — ABNORMAL LOW (ref 32.0–36.0)
MCV: 80.3 fL (ref 80.0–100.0)
Monocytes Absolute: 0.5 10*3/uL (ref 0.2–0.9)
Monocytes Relative: 6 %
NEUTROS PCT: 56 %
Neutro Abs: 4.1 10*3/uL (ref 1.4–6.5)
PLATELETS: 281 10*3/uL (ref 150–440)
RBC: 5.21 MIL/uL — AB (ref 3.80–5.20)
RDW: 13.5 % (ref 11.5–14.5)
WBC: 7.4 10*3/uL (ref 3.6–11.0)

## 2016-06-27 LAB — COMPREHENSIVE METABOLIC PANEL
ALBUMIN: 3.8 g/dL (ref 3.5–5.0)
ALK PHOS: 87 U/L (ref 38–126)
ALT: 23 U/L (ref 14–54)
AST: 18 U/L (ref 15–41)
Anion gap: 5 (ref 5–15)
BUN: 16 mg/dL (ref 6–20)
CALCIUM: 9.2 mg/dL (ref 8.9–10.3)
CHLORIDE: 106 mmol/L (ref 101–111)
CO2: 29 mmol/L (ref 22–32)
CREATININE: 0.86 mg/dL (ref 0.44–1.00)
GFR calc non Af Amer: >60 mL/min (ref >60)
Glucose, Bld: 104 mg/dL — ABNORMAL HIGH (ref 65–99)
Potassium: 3.8 mmol/L (ref 3.5–5.1)
SODIUM: 140 mmol/L (ref 135–145)
TOTAL PROTEIN: 7.3 g/dL (ref 6.5–8.1)
Total Bilirubin: 0.6 mg/dL (ref 0.3–1.2)

## 2016-06-27 MED ORDER — DEXAMETHASONE 4 MG PO TABS
ORAL_TABLET | ORAL | 0 refills | Status: DC
Start: 2016-06-27 — End: 2016-11-30

## 2016-06-27 MED ORDER — PROCHLORPERAZINE MALEATE 10 MG PO TABS: 10.0000 mg | ORAL_TABLET | Freq: Four times a day (QID) | ORAL | 3 refills | Status: DC | PRN

## 2016-06-27 MED ORDER — ONDANSETRON HCL 8 MG PO TABS: 8.0000 mg | ORAL_TABLET | Freq: Three times a day (TID) | ORAL | 3 refills | Status: DC | PRN

## 2016-06-27 NOTE — Progress Notes (Signed)
[place] NOTE  Patient Care Team: Birdie Sons, MD as PCP - General (Family Medicine) Robert Bellow, MD (General Surgery) Margarita Rana, MD as Referring Physician (Family Medicine)  CHIEF COMPLAINTS/PURPOSE OF CONSULTATION:  Breast cancer   #  Oncology History   # LEFT BREAST CANCER [[id number]] ER/PR/her 2 Neu- NEG. S/p Lumpec [Dr.Byrnett]; Mammosite [Dr.Crystal]; Mammaprint- 29% risk of recurrence; Recom TC x4.      Malignant neoplasm of upper-outer quadrant of left female breast ([place])   04/18/2016 Initial Diagnosis    Malignant neoplasm of upper-outer quadrant of left female breast ([place])       HISTORY OF PRESENTING ILLNESS:  Theresa Acosta 65 y.o.  female with no significant family history of breast cancer - noted to have an abnormal screening mammogram in June 2017 which was further worked up with ultrasound- biopsy positive for triple negative breast cancer. Patient subsequently underwent lumpectomy and sentinel lymph node biopsy with Dr. Bary Castilla- pathology as above.  Patient is here to discuss further treatment options. Patient is healing well. Denies any wound healing issues or infection issues.  Patient denies any history of shortness of breath or cough. Denies any bone pain. Denies any hot flashes. No tingling or numbness.  ROS: A complete 10 point review of system is done which is negative except mentioned above in history of present illness  MEDICAL HISTORY:  Past Medical History:  Diagnosis Date  . Cancer ([place])   . Personal history of colonic polyps 2007    SURGICAL HISTORY: Past Surgical History:  Procedure Laterality Date  . 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  . BREAST LUMPECTOMY WITH NEEDLE LOCALIZATION Left 05/26/2016   Procedure: BREAST LUMPECTOMY WITH SENTINEL LYMPH NODE BX and needle localization;  Surgeon: Robert Bellow, MD;   Location: [facility] ORS;  Service: General;  Laterality: Left;  . BUNIONECTOMY  2012  . COLONOSCOPY  2007 ( serated adenoma of the rectum), 2012 (normal)   Dr Bary Castilla  . COLONOSCOPY WITH PROPOFOL N/A 03/21/2016   Procedure: COLONOSCOPY WITH PROPOFOL;  Surgeon: Robert Bellow, MD;  Location: [facility] ENDOSCOPY;  Service: Endoscopy;  Laterality: N/A;  . SENTINEL NODE BIOPSY Left 05/26/2016   Procedure: SENTINEL NODE BIOPSY;  Surgeon: Robert Bellow, MD;  Location: [facility] ORS;  Service: General;  Laterality: Left;  . 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 Marital status: Divorced    Spouse name: N/A  . Number of children: 2  . Years of education: N/A   Occupational History  . Not on file.   Social History Main Topics  . Smoking status: Former Smoker    Packs/day: 2.00    Years: 30.00    Quit date: 02/27/2016  . Smokeless tobacco: Never Used  . Alcohol use No  . Drug use: No  . Sexual activity: Not on file   Other Topics Concern  . Not on file   Social History Narrative  . No narrative on file    FAMILY HISTORY: no hx of cancer in family. Youngest of 12 siblings.  Family History  Problem Relation Age of Onset  . Healthy Sister   . Healthy Brother   . Healthy Sister   . Healthy Brother   . Healthy Brother   . Cancer Neg Hx   .  Breast cancer Neg Hx     ALLERGIES:  has No Known Allergies.  MEDICATIONS:  Current Outpatient Prescriptions  Medication Sig Dispense Refill  . cefadroxil (DURICEF) 500 MG capsule Take 1 capsule (500 mg total) by mouth 2 (two) times daily. Start one hour before office procedure on 06-26-16 24 capsule 0  . dexamethasone (DECADRON) 4 MG tablet Take 2 pills every 12 hours x 1 day; Start the day prior to chemo. Take it with food. 30 tablet 0  . ondansetron (ZOFRAN) 8 MG tablet Take 1 tablet (8 mg total) by mouth every 8 (eight) hours as  needed for nausea or vomiting. 30 tablet 3  . prochlorperazine (COMPAZINE) 10 MG tablet Take 1 tablet (10 mg total) by mouth every 6 (six) hours as needed for nausea or vomiting. 30 tablet 3   No current facility-administered medications for this visit.       Marland Kitchen  PHYSICAL EXAMINATION: ECOG PERFORMANCE STATUS: 0 - Asymptomatic  Vitals:   06/27/16 1356  BP: 139/78  Pulse: 72  Resp: 18  Temp: 98.1 F (36.7 C)   Filed Weights   06/27/16 1356  Weight: 184 lb 8.4 oz (83.7 kg)    GENERAL: Well-nourished well-developed; Alert, no distress and comfortable.   Alone.  EYES: no pallor or icterus OROPHARYNX: no thrush or ulceration; good dentition  NECK: supple, no masses felt LYMPH:  no palpable lymphadenopathy in the cervical, axillary or inguinal regions LUNGS: clear to auscultation and  No wheeze or crackles HEART/CVS: regular rate & rhythm and no murmurs; No lower extremity edema ABDOMEN: abdomen soft, non-tender and normal bowel sounds Musculoskeletal:no cyanosis of digits and no clubbing  PSYCH: alert & oriented x 3 with fluent speech NEURO: no focal motor/sensory deficits SKIN:  no rashes or significant lesions  LABORATORY DATA:  I have reviewed the data as listed Lab Results  Component Value Date   WBC 7.4 06/27/2016   HGB 13.3 06/27/2016   HCT 41.8 06/27/2016   MCV 80.3 06/27/2016   PLT 281 06/27/2016    Recent Labs  06/27/16 1448  NA 140  K 3.8  CL 106  CO2 29  GLUCOSE 104*  BUN 16  CREATININE 0.86  CALCIUM 9.2  GFRNONAA >60  GFRAA >60  PROT 7.3  ALBUMIN 3.8  AST 18  ALT 23  ALKPHOS 87  BILITOT 0.6    RADIOGRAPHIC STUDIES: I have personally reviewed the radiological images as listed and agreed with the findings in the report. US Breast Complete Uni Left Inc Axilla  Result Date: 06/26/2016 The procedure was reviewed. The patient was amenable to proceed. The breast was prepped with alcohol and 10 mL of 0.5% Xylocaine with 0.25% Marcaine with  1-200,000 epinephrine was instilled. The area was then prepped with ChloraPrep and draped. An incision was made followed by the 8 mm trocar. The cavity evaluation device had been pretested and easily inflated and desufflated with a spherical balloon. Once was placed in the patient however inflation became difficult after 2 mL of instilled. The balloon was worked back and the valve was nonfunctioning. The cavity evaluation device was set aside to return to the manufacturer.  The treatment balloon easily inflated and deflated to 60 mL. The balloon was deflated and placed in the position 9 cm from the skin. It was inflated with 40 mL of saline to which Omnipaque was added. Minimal spacing on transverse and longitudinal imaging was 1.4 cm from the skin. A spherical insufflation noted. The balloon did extend  to the pectoralis fascia. No air pockets were evident. Bacitracin ointment was placed on the exit site followed by bulky dressing.   US Breast Ltd Uni Left Inc Axilla  Result Date: 05/31/2016 Ultrasound examination of the left breast was done to sew assess patient's candidacy for accelerated partial breast radiation. There is a well-defined seroma cavity measuring 1.8 x 2.6 x 6.5 cm with a minimum distance to the skin of 1.67 cm. Adequate for balloon placement.   ASSESSMENT & PLAN:   Malignant neoplasm of upper-outer quadrant of left female breast ([place]) Stage I triple negative breast cancer status post lumpectomy; plan for MammoSite radiation next week.  Patient's MammaPrint- high risk- translating to about 30% risk of recurrence in the next 10 years. I would recommend adjuvant chemotherapy with Taxotere Cytoxan every 3 weeks 4 treatments.  Growth factor-Neulasta/On pro would be given as prophylaxis for chemotherapy-induced neutropenia to prevent febrile neutropenias. Discussed potential side effect- myalgias/arthralgias- recommend Claritin for 4 days.   Discussed the potential side effects  including but not limited to-increasing fatigue, nausea vomiting, diarrhea, hair loss, sores in the mouth, increase risk of infection and also neuropathy.   # Chemotherapy education;  port placement. Hopefully the planned start chemotherapy week of 25th sep. Antiemetics-Zofran and Compazine;dex 4mg  prep sent to pharmacy  # I spoke to Dr.Crystal re: my above plan. I also left message for Dr.Byrnett re: above plan.   # Patient will follow-up with me on September 25 to start treatment/labs.  Thank you Dr.Byrnett for allowing me to participate in the care of your pleasant patient. Please do not hesitate to contact me with questions or concerns in the interim.  All questions were answered. The patient knows to call the clinic with any problems, questions or concerns.    Cammie Sickle, MD 06/27/2016 5:27 PM

## 2016-06-27 NOTE — Assessment & Plan Note (Addendum)
Stage I triple negative breast cancer status post lumpectomy; plan for MammoSite radiation next week.  Patient's MammaPrint- high risk- translating to about 30% risk of recurrence in the next 10 years. I would recommend adjuvant chemotherapy with Taxotere Cytoxan every 3 weeks 4 treatments.  Growth factor-Neulasta/On pro would be given as prophylaxis for chemotherapy-induced neutropenia to prevent febrile neutropenias. Discussed potential side effect- myalgias/arthralgias- recommend Claritin for 4 days.   Discussed the potential side effects including but not limited to-increasing fatigue, nausea vomiting, diarrhea, hair loss, sores in the mouth, increase risk of infection and also neuropathy.   # Chemotherapy education;  port placement. Hopefully the planned start chemotherapy week of 25th sep. Antiemetics-Zofran and Compazine;dex 4mg  prep sent to pharmacy  # I spoke to Dr.Crystal re: my above plan. I also left message for Dr.Byrnett re: above plan.   # Patient will follow-up with me on September 25 to start treatment/labs.  Thank you Dr.Byrnett for allowing me to participate in the care of your pleasant patient. Please do not hesitate to contact me with questions or concerns in the interim.

## 2016-06-27 NOTE — Progress Notes (Signed)
Patient here today as new evaluation regarding breast cancer.  Referred by Dr. Bary Castilla. Possible mammosite. Patient established with Dr. Clayton Lefort.  Notes in [place].

## 2016-06-28 ENCOUNTER — Ambulatory Visit
Admission: RE | Admit: 2016-06-28 | Discharge: 2016-06-28 | Disposition: A | Discharge status: Home / Self Care | Payer: 59 | Source: Ambulatory Visit | Attending: Radiation Oncology | Admitting: Radiation Oncology

## 2016-06-28 DIAGNOSIS — Z171 Estrogen receptor negative status [ER-]: Secondary | ICD-10-CM | POA: Diagnosis not present

## 2016-06-28 DIAGNOSIS — Z510 Encounter for antineoplastic radiation therapy: Secondary | ICD-10-CM | POA: Diagnosis not present

## 2016-06-28 DIAGNOSIS — Z86010 Personal history of colonic polyps: Secondary | ICD-10-CM | POA: Diagnosis not present

## 2016-06-28 DIAGNOSIS — Z87891 Personal history of nicotine dependence: Secondary | ICD-10-CM | POA: Diagnosis not present

## 2016-06-28 DIAGNOSIS — C50412 Malignant neoplasm of upper-outer quadrant of left female breast: Secondary | ICD-10-CM | POA: Diagnosis not present

## 2016-06-28 NOTE — Patient Instructions (Signed)
Docetaxel injection  What is this medicine?  DOCETAXEL (doe se TAX el) is a chemotherapy drug. It targets fast dividing cells, like cancer cells, and causes these cells to die. This medicine is used to treat many types of cancers like breast cancer, certain stomach cancers, head and neck cancer, lung cancer, and prostate cancer.  This medicine may be used for other purposes; ask your health care provider or pharmacist if you have questions.  What should I tell my health care provider before I take this medicine?  They need to know if you have any of these conditions:  -infection (especially a virus infection such as chickenpox, cold sores, or herpes)  -liver disease  -low blood counts, like low white cell, platelet, or red cell counts  -an unusual or allergic reaction to docetaxel, polysorbate 80, other chemotherapy agents, other medicines, foods, dyes, or preservatives  -pregnant or trying to get pregnant  -breast-feeding  How should I use this medicine?  This drug is given as an infusion into a vein. It is administered in a hospital or clinic by a specially trained health care professional.  Talk to your pediatrician regarding the use of this medicine in children. Special care may be needed.  Overdosage: If you think you have taken too much of this medicine contact a poison control center or emergency room at once.  NOTE: This medicine is only for you. Do not share this medicine with others.  What if I miss a dose?  It is important not to miss your dose. Call your doctor or health care professional if you are unable to keep an appointment.  What may interact with this medicine?  -cyclosporine  -erythromycin  -ketoconazole  -medicines to increase blood counts like filgrastim, pegfilgrastim, sargramostim  -vaccines  Talk to your doctor or health care professional before taking any of these medicines:  -acetaminophen  -aspirin  -ibuprofen  -ketoprofen  -naproxen  This list may not describe all possible interactions.  Give your health care provider a list of all the medicines, herbs, non-prescription drugs, or dietary supplements you use. Also tell them if you smoke, drink alcohol, or use illegal drugs. Some items may interact with your medicine.  What should I watch for while using this medicine?  Your condition will be monitored carefully while you are receiving this medicine. You will need important blood work done while you are taking this medicine.  This drug may make you feel generally unwell. This is not uncommon, as chemotherapy can affect healthy cells as well as cancer cells. Report any side effects. Continue your course of treatment even though you feel ill unless your doctor tells you to stop.  In some cases, you may be given additional medicines to help with side effects. Follow all directions for their use.  Call your doctor or health care professional for advice if you get a fever, chills or sore throat, or other symptoms of a cold or flu. Do not treat yourself. This drug decreases your body's ability to fight infections. Try to avoid being around people who are sick.  This medicine may increase your risk to bruise or bleed. Call your doctor or health care professional if you notice any unusual bleeding.  This medicine may contain alcohol in the product. You may get drowsy or dizzy. Do not drive, use machinery, or do anything that needs mental alertness until you know how this medicine affects you. Do not stand or sit up quickly, especially if you are an older   patient. This reduces the risk of dizzy or fainting spells. Avoid alcoholic drinks.  Do not become pregnant while taking this medicine. Women should inform their doctor if they wish to become pregnant or think they might be pregnant. There is a potential for serious side effects to an unborn child. Talk to your health care professional or pharmacist for more information. Do not breast-feed an infant while taking this medicine.  What side effects may I notice  from receiving this medicine?  Side effects that you should report to your doctor or health care professional as soon as possible:  -allergic reactions like skin rash, itching or hives, swelling of the face, lips, or tongue  -low blood counts - This drug may decrease the number of white blood cells, red blood cells and platelets. You may be at increased risk for infections and bleeding.  -signs of infection - fever or chills, cough, sore throat, pain or difficulty passing urine  -signs of decreased platelets or bleeding - bruising, pinpoint red spots on the skin, black, tarry stools, nosebleeds  -signs of decreased red blood cells - unusually weak or tired, fainting spells, lightheadedness  -breathing problems  -fast or irregular heartbeat  -low blood pressure  -mouth sores  -nausea and vomiting  -pain, swelling, redness or irritation at the injection site  -pain, tingling, numbness in the hands or feet  -swelling of the ankle, feet, hands  -weight gain  Side effects that usually do not require medical attention (report to your prescriber or health care professional if they continue or are bothersome):  -bone pain  -complete hair loss including hair on your head, underarms, pubic hair, eyebrows, and eyelashes  -diarrhea  -excessive tearing  -changes in the color of fingernails  -loosening of the fingernails  -nausea  -muscle pain  -red flush to skin  -sweating  -weak or tired  This list may not describe all possible side effects. Call your doctor for medical advice about side effects. You may report side effects to FDA at 1-800-FDA-1088.  Where should I keep my medicine?  This drug is given in a hospital or clinic and will not be stored at home.  NOTE: This sheet is a summary. It may not cover all possible information. If you have questions about this medicine, talk to your doctor, pharmacist, or health care provider.      2016, Elsevier/Gold Standard. (2014-11-02 16:04:57)    Cyclophosphamide injection  What is  this medicine?  CYCLOPHOSPHAMIDE (sye kloe FOSS fa mide) is a chemotherapy drug. It slows the growth of cancer cells. This medicine is used to treat many types of cancer like lymphoma, myeloma, leukemia, breast cancer, and ovarian cancer, to name a few.  This medicine may be used for other purposes; ask your health care provider or pharmacist if you have questions.  What should I tell my health care provider before I take this medicine?  They need to know if you have any of these conditions:  -blood disorders  -history of other chemotherapy  -infection  -kidney disease  -liver disease  -recent or ongoing radiation therapy  -tumors in the bone marrow  -an unusual or allergic reaction to cyclophosphamide, other chemotherapy, other medicines, foods, dyes, or preservatives  -pregnant or trying to get pregnant  -breast-feeding  How should I use this medicine?  This drug is usually given as an injection into a vein or muscle or by infusion into a vein. It is administered in a hospital or clinic by a   specially trained health care professional.  Talk to your pediatrician regarding the use of this medicine in children. Special care may be needed.  Overdosage: If you think you have taken too much of this medicine contact a poison control center or emergency room at once.  NOTE: This medicine is only for you. Do not share this medicine with others.  What if I miss a dose?  It is important not to miss your dose. Call your doctor or health care professional if you are unable to keep an appointment.  What may interact with this medicine?  This medicine may interact with the following medications:  -amiodarone  -amphotericin B  -azathioprine  -certain antiviral medicines for HIV or AIDS such as protease inhibitors (e.g., indinavir, ritonavir) and zidovudine  -certain blood pressure medications such as benazepril, captopril, enalapril, fosinopril, lisinopril, moexipril, monopril, perindopril, quinapril, ramipril,  trandolapril  -certain cancer medications such as anthracyclines (e.g., daunorubicin, doxorubicin), busulfan, cytarabine, paclitaxel, pentostatin, tamoxifen, trastuzumab  -certain diuretics such as chlorothiazide, chlorthalidone, hydrochlorothiazide, indapamide, metolazone  -certain medicines that treat or prevent blood clots like warfarin  -certain muscle relaxants such as succinylcholine  -cyclosporine  -etanercept  -indomethacin  -medicines to increase blood counts like filgrastim, pegfilgrastim, sargramostim  -medicines used as general anesthesia  -metronidazole  -natalizumab  This list may not describe all possible interactions. Give your health care provider a list of all the medicines, herbs, non-prescription drugs, or dietary supplements you use. Also tell them if you smoke, drink alcohol, or use illegal drugs. Some items may interact with your medicine.  What should I watch for while using this medicine?  Visit your doctor for checks on your progress. This drug may make you feel generally unwell. This is not uncommon, as chemotherapy can affect healthy cells as well as cancer cells. Report any side effects. Continue your course of treatment even though you feel ill unless your doctor tells you to stop.  Drink water or other fluids as directed. Urinate often, even at night.  In some cases, you may be given additional medicines to help with side effects. Follow all directions for their use.  Call your doctor or health care professional for advice if you get a fever, chills or sore throat, or other symptoms of a cold or flu. Do not treat yourself. This drug decreases your body's ability to fight infections. Try to avoid being around people who are sick.  This medicine may increase your risk to bruise or bleed. Call your doctor or health care professional if you notice any unusual bleeding.  Be careful brushing and flossing your teeth or using a toothpick because you may get an infection or bleed more easily.  If you have any dental work done, tell your dentist you are receiving this medicine.  You may get drowsy or dizzy. Do not drive, use machinery, or do anything that needs mental alertness until you know how this medicine affects you.  Do not become pregnant while taking this medicine or for 1 year after stopping it. Women should inform their doctor if they wish to become pregnant or think they might be pregnant. Men should not father a child while taking this medicine and for 4 months after stopping it. There is a potential for serious side effects to an unborn child. Talk to your health care professional or pharmacist for more information. Do not breast-feed an infant while taking this medicine.  This medicine may interfere with the ability to have a child. This medicine has   caused ovarian failure in some women. This medicine has caused reduced sperm counts in some men. You should talk with your doctor or health care professional if you are concerned about your fertility.  If you are going to have surgery, tell your doctor or health care professional that you have taken this medicine.  What side effects may I notice from receiving this medicine?  Side effects that you should report to your doctor or health care professional as soon as possible:  -allergic reactions like skin rash, itching or hives, swelling of the face, lips, or tongue  -low blood counts - this medicine may decrease the number of white blood cells, red blood cells and platelets. You may be at increased risk for infections and bleeding.  -signs of infection - fever or chills, cough, sore throat, pain or difficulty passing urine  -signs of decreased platelets or bleeding - bruising, pinpoint red spots on the skin, black, tarry stools, blood in the urine  -signs of decreased red blood cells - unusually weak or tired, fainting spells, lightheadedness  -breathing problems  -dark urine  -dizziness  -palpitations  -swelling of the ankles, feet,  hands  -trouble passing urine or change in the amount of urine  -weight gain  -yellowing of the eyes or skin  Side effects that usually do not require medical attention (report to your doctor or health care professional if they continue or are bothersome):  -changes in nail or skin color  -hair loss  -missed menstrual periods  -mouth sores  -nausea, vomiting  This list may not describe all possible side effects. Call your doctor for medical advice about side effects. You may report side effects to FDA at 1-800-FDA-1088.  Where should I keep my medicine?  This drug is given in a hospital or clinic and will not be stored at home.  NOTE: This sheet is a summary. It may not cover all possible information. If you have questions about this medicine, talk to your doctor, pharmacist, or health care provider.      2016, Elsevier/Gold Standard. (2012-08-30 16:22:58)    Pegfilgrastim injection  What is this medicine?  PEGFILGRASTIM (PEG fil gra stim) is a long-acting granulocyte colony-stimulating factor that stimulates the growth of neutrophils, a type of white blood cell important in the body's fight against infection. It is used to reduce the incidence of fever and infection in patients with certain types of cancer who are receiving chemotherapy that affects the bone marrow, and to increase survival after being exposed to high doses of radiation.  This medicine may be used for other purposes; ask your health care provider or pharmacist if you have questions.  What should I tell my health care provider before I take this medicine?  They need to know if you have any of these conditions:  -kidney disease  -latex allergy  -ongoing radiation therapy  -sickle cell disease  -skin reactions to acrylic adhesives (On-Body Injector only)  -an unusual or allergic reaction to pegfilgrastim, filgrastim, other medicines, foods, dyes, or preservatives  -pregnant or trying to get pregnant  -breast-feeding  How should I use this  medicine?  This medicine is for injection under the skin. If you get this medicine at home, you will be taught how to prepare and give the pre-filled syringe or how to use the On-body Injector. Refer to the patient Instructions for Use for detailed instructions. Use exactly as directed. Take your medicine at regular intervals. Do not take your medicine more often   than directed.  It is important that you put your used needles and syringes in a special sharps container. Do not put them in a trash can. If you do not have a sharps container, call your pharmacist or healthcare provider to get one.  Talk to your pediatrician regarding the use of this medicine in children. While this drug may be prescribed for selected conditions, precautions do apply.  Overdosage: If you think you have taken too much of this medicine contact a poison control center or emergency room at once.  NOTE: This medicine is only for you. Do not share this medicine with others.  What if I miss a dose?  It is important not to miss your dose. Call your doctor or health care professional if you miss your dose. If you miss a dose due to an On-body Injector failure or leakage, a new dose should be administered as soon as possible using a single prefilled syringe for manual use.  What may interact with this medicine?  Interactions have not been studied.  Give your health care provider a list of all the medicines, herbs, non-prescription drugs, or dietary supplements you use. Also tell them if you smoke, drink alcohol, or use illegal drugs. Some items may interact with your medicine.  This list may not describe all possible interactions. Give your health care provider a list of all the medicines, herbs, non-prescription drugs, or dietary supplements you use. Also tell them if you smoke, drink alcohol, or use illegal drugs. Some items may interact with your medicine.  What should I watch for while using this medicine?  You may need blood work done while  you are taking this medicine.  If you are going to need a MRI, CT scan, or other procedure, tell your doctor that you are using this medicine (On-Body Injector only).  What side effects may I notice from receiving this medicine?  Side effects that you should report to your doctor or health care professional as soon as possible:  -allergic reactions like skin rash, itching or hives, swelling of the face, lips, or tongue  -dizziness  -fever  -pain, redness, or irritation at site where injected  -pinpoint red spots on the skin  -red or dark-brown urine  -shortness of breath or breathing problems  -stomach or side pain, or pain at the shoulder  -swelling  -tiredness  -trouble passing urine or change in the amount of urine  Side effects that usually do not require medical attention (report to your doctor or health care professional if they continue or are bothersome):  -bone pain  -muscle pain  This list may not describe all possible side effects. Call your doctor for medical advice about side effects. You may report side effects to FDA at 1-800-FDA-1088.  Where should I keep my medicine?  Keep out of the reach of children.  Store pre-filled syringes in a refrigerator between 2 and 8 degrees C (36 and 46 degrees F). Do not freeze. Keep in carton to protect from light. Throw away this medicine if it is left out of the refrigerator for more than 48 hours. Throw away any unused medicine after the expiration date.  NOTE: This sheet is a summary. It may not cover all possible information. If you have questions about this medicine, talk to your doctor, pharmacist, or health care provider.      2016, Elsevier/Gold Standard. (2014-11-05 14:30:14)

## 2016-06-29 ENCOUNTER — Ambulatory Visit
Admission: RE | Admit: 2016-06-29 | Discharge: 2016-06-29 | Disposition: A | Discharge status: Home / Self Care | Payer: 59 | Source: Ambulatory Visit | Attending: Radiation Oncology | Admitting: Radiation Oncology

## 2016-06-29 ENCOUNTER — Inpatient Hospital Stay: Payer: 59

## 2016-06-29 DIAGNOSIS — Z510 Encounter for antineoplastic radiation therapy: Secondary | ICD-10-CM | POA: Diagnosis not present

## 2016-06-29 DIAGNOSIS — Z86010 Personal history of colonic polyps: Secondary | ICD-10-CM | POA: Diagnosis not present

## 2016-06-29 DIAGNOSIS — Z87891 Personal history of nicotine dependence: Secondary | ICD-10-CM | POA: Diagnosis not present

## 2016-06-29 DIAGNOSIS — C50412 Malignant neoplasm of upper-outer quadrant of left female breast: Secondary | ICD-10-CM | POA: Diagnosis not present

## 2016-06-29 DIAGNOSIS — Z171 Estrogen receptor negative status [ER-]: Secondary | ICD-10-CM | POA: Diagnosis not present

## 2016-07-04 ENCOUNTER — Ambulatory Visit
Admission: RE | Admit: 2016-07-04 | Discharge: 2016-07-04 | Disposition: A | Discharge status: Home / Self Care | Payer: 59 | Source: Ambulatory Visit | Attending: Radiation Oncology | Admitting: Radiation Oncology

## 2016-07-04 DIAGNOSIS — Z510 Encounter for antineoplastic radiation therapy: Secondary | ICD-10-CM | POA: Diagnosis not present

## 2016-07-04 DIAGNOSIS — C50412 Malignant neoplasm of upper-outer quadrant of left female breast: Secondary | ICD-10-CM | POA: Diagnosis not present

## 2016-07-04 DIAGNOSIS — Z86010 Personal history of colonic polyps: Secondary | ICD-10-CM | POA: Diagnosis not present

## 2016-07-04 DIAGNOSIS — Z87891 Personal history of nicotine dependence: Secondary | ICD-10-CM | POA: Diagnosis not present

## 2016-07-04 DIAGNOSIS — Z171 Estrogen receptor negative status [ER-]: Secondary | ICD-10-CM | POA: Diagnosis not present

## 2016-07-05 ENCOUNTER — Ambulatory Visit
Admission: RE | Admit: 2016-07-05 | Discharge: 2016-07-05 | Disposition: A | Discharge status: Home / Self Care | Payer: 59 | Source: Ambulatory Visit | Attending: Radiation Oncology | Admitting: Radiation Oncology

## 2016-07-05 DIAGNOSIS — Z87891 Personal history of nicotine dependence: Secondary | ICD-10-CM | POA: Diagnosis not present

## 2016-07-05 DIAGNOSIS — C50412 Malignant neoplasm of upper-outer quadrant of left female breast: Secondary | ICD-10-CM | POA: Diagnosis not present

## 2016-07-05 DIAGNOSIS — Z510 Encounter for antineoplastic radiation therapy: Secondary | ICD-10-CM | POA: Diagnosis not present

## 2016-07-05 DIAGNOSIS — Z171 Estrogen receptor negative status [ER-]: Secondary | ICD-10-CM | POA: Diagnosis not present

## 2016-07-05 DIAGNOSIS — Z86010 Personal history of colonic polyps: Secondary | ICD-10-CM | POA: Diagnosis not present

## 2016-07-06 ENCOUNTER — Ambulatory Visit
Admission: RE | Admit: 2016-07-06 | Discharge: 2016-07-06 | Disposition: A | Discharge status: Home / Self Care | Payer: 59 | Source: Ambulatory Visit | Attending: Radiation Oncology | Admitting: Radiation Oncology

## 2016-07-06 ENCOUNTER — Ambulatory Visit: Payer: 59 | Admitting: General Surgery

## 2016-07-06 DIAGNOSIS — C50412 Malignant neoplasm of upper-outer quadrant of left female breast: Secondary | ICD-10-CM | POA: Diagnosis not present

## 2016-07-06 DIAGNOSIS — Z86010 Personal history of colonic polyps: Secondary | ICD-10-CM | POA: Diagnosis not present

## 2016-07-06 DIAGNOSIS — Z171 Estrogen receptor negative status [ER-]: Secondary | ICD-10-CM | POA: Diagnosis not present

## 2016-07-06 DIAGNOSIS — Z510 Encounter for antineoplastic radiation therapy: Secondary | ICD-10-CM | POA: Diagnosis not present

## 2016-07-06 DIAGNOSIS — Z87891 Personal history of nicotine dependence: Secondary | ICD-10-CM | POA: Diagnosis not present

## 2016-07-07 ENCOUNTER — Ambulatory Visit
Admission: RE | Admit: 2016-07-07 | Discharge: 2016-07-07 | Disposition: A | Discharge status: Home / Self Care | Payer: 59 | Source: Ambulatory Visit | Attending: Radiation Oncology | Admitting: Radiation Oncology

## 2016-07-07 DIAGNOSIS — Z171 Estrogen receptor negative status [ER-]: Secondary | ICD-10-CM | POA: Diagnosis not present

## 2016-07-07 DIAGNOSIS — Z510 Encounter for antineoplastic radiation therapy: Secondary | ICD-10-CM | POA: Diagnosis not present

## 2016-07-07 DIAGNOSIS — Z87891 Personal history of nicotine dependence: Secondary | ICD-10-CM | POA: Diagnosis not present

## 2016-07-07 DIAGNOSIS — C50412 Malignant neoplasm of upper-outer quadrant of left female breast: Secondary | ICD-10-CM | POA: Diagnosis not present

## 2016-07-07 DIAGNOSIS — Z86010 Personal history of colonic polyps: Secondary | ICD-10-CM | POA: Diagnosis not present

## 2016-07-12 ENCOUNTER — Encounter: Payer: Self-pay | Admitting: General Surgery

## 2016-07-12 ENCOUNTER — Ambulatory Visit (INDEPENDENT_AMBULATORY_CARE_PROVIDER_SITE_OTHER): Payer: 59 | Admitting: General Surgery

## 2016-07-12 VITALS — BP 112/70 | HR 62 | Resp 12 | Ht 63.0 in | Wt 186.0 lb

## 2016-07-12 DIAGNOSIS — C50412 Malignant neoplasm of upper-outer quadrant of left female breast: Secondary | ICD-10-CM

## 2016-07-12 NOTE — Patient Instructions (Addendum)
The patient is aware to call back for any questions or concerns. Follow up in 4 months with office visit. Hydrocortisone cream to irritated area left breast.

## 2016-07-12 NOTE — Progress Notes (Signed)
Patient ID: HERO PREM, female   DOB: 13-Apr-1951, 65 y.o.   MRN: [id number]  Chief Complaint  Patient presents with  . Follow-up    HPI Theresa Acosta is a 65 y.o. female here today for follow up post mammosite radiation treatment. Plan for chemotherapy to start 07-24-16. No new breast issues.  HPI  Past Medical History:  Diagnosis Date  . Cancer ([place])   . Personal history of colonic polyps 2007    Past Surgical History:  Procedure Laterality Date  . 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  . BREAST LUMPECTOMY WITH NEEDLE LOCALIZATION Left 05/26/2016   Procedure: BREAST LUMPECTOMY WITH SENTINEL LYMPH NODE BX and needle localization;  Surgeon: Robert Bellow, MD;  Location: [facility] ORS;  Service: General;  Laterality: Left;  . BREAST MAMMOSITE Left 06/26/2016  . BUNIONECTOMY  2012  . COLONOSCOPY  2007 ( serated adenoma of the rectum), 2012 (normal)   Dr Bary Castilla  . COLONOSCOPY WITH PROPOFOL N/A 03/21/2016   Procedure: COLONOSCOPY WITH PROPOFOL;  Surgeon: Robert Bellow, MD;  Location: [facility] ENDOSCOPY;  Service: Endoscopy;  Laterality: N/A;  . SENTINEL NODE BIOPSY Left 05/26/2016   Procedure: SENTINEL NODE BIOPSY;  Surgeon: Robert Bellow, MD;  Location: [facility] ORS;  Service: General;  Laterality: Left;  . TUBAL LIGATION  1977    Family History  Problem Relation Age of Onset  . Healthy Sister   . Healthy Brother   . Healthy Sister   . Healthy Brother   . Healthy Brother   . Cancer Neg Hx   . Breast cancer Neg Hx     Social History Social History  Substance Use Topics  . Smoking status: Former Smoker    Packs/day: 2.00    Years: 30.00    Quit date: 02/27/2016  . Smokeless tobacco: Never Used  . Alcohol use No    No Known Allergies  Current Outpatient Prescriptions  Medication Sig Dispense Refill  . dexamethasone (DECADRON) 4 MG tablet Take 2 pills every 12 hours x 1  day; Start the day prior to chemo. Take it with food. 30 tablet 0  . ondansetron (ZOFRAN) 8 MG tablet Take 1 tablet (8 mg total) by mouth every 8 (eight) hours as needed for nausea or vomiting. 30 tablet 3  . prochlorperazine (COMPAZINE) 10 MG tablet Take 1 tablet (10 mg total) by mouth every 6 (six) hours as needed for nausea or vomiting. 30 tablet 3   No current facility-administered medications for this visit.     Review of Systems Review of Systems  Constitutional: Negative.   Respiratory: Negative.   Cardiovascular: Negative.     Blood pressure 112/70, pulse 62, resp. rate 12, height 5\' 3"  (1.6 m), weight 186 lb (84.4 kg).  Physical Exam Physical Exam  Constitutional: She is oriented to person, place, and time. She appears well-developed and well-nourished.  Pulmonary/Chest:    Incision clean left breast, mild irritation from tape  Neurological: She is alert and oriented to person, place, and time.  Skin: Skin is warm and dry.  Psychiatric: Her behavior is normal.    Data Reviewed No clubbing oncology plans for 4 cycles of Axid tear and Cytoxan. Plans are to complete this with peripheral infusion.  Assessment    Tolerated breast radiation well.    Plan    We'll follow up after she is completed her adjuvant chemotherapy.    Follow up in  4 months with office visit. Hydrocortisone cream to irritated area left breast. The patient is aware to call back for any questions or concerns.  This information has been scribed by Karie Fetch RN, BSN,BC.   Robert Bellow 07/14/2016, 9:36 AM

## 2016-07-14 ENCOUNTER — Encounter: Payer: Self-pay | Admitting: General Surgery

## 2016-07-24 ENCOUNTER — Encounter: Payer: Self-pay | Admitting: Internal Medicine

## 2016-07-24 ENCOUNTER — Inpatient Hospital Stay: Payer: 59 | Attending: Internal Medicine | Admitting: Internal Medicine

## 2016-07-24 ENCOUNTER — Inpatient Hospital Stay: Payer: 59

## 2016-07-24 VITALS — BP 145/72 | HR 81 | Temp 97.5°F | Resp 17 | Ht 63.0 in | Wt 187.2 lb

## 2016-07-24 DIAGNOSIS — Z87891 Personal history of nicotine dependence: Secondary | ICD-10-CM | POA: Diagnosis not present

## 2016-07-24 DIAGNOSIS — C50412 Malignant neoplasm of upper-outer quadrant of left female breast: Secondary | ICD-10-CM

## 2016-07-24 DIAGNOSIS — Z5111 Encounter for antineoplastic chemotherapy: Secondary | ICD-10-CM | POA: Insufficient documentation

## 2016-07-24 DIAGNOSIS — Z79899 Other long term (current) drug therapy: Secondary | ICD-10-CM | POA: Insufficient documentation

## 2016-07-24 DIAGNOSIS — Z171 Estrogen receptor negative status [ER-]: Secondary | ICD-10-CM | POA: Diagnosis not present

## 2016-07-24 LAB — COMPREHENSIVE METABOLIC PANEL
ALK PHOS: 78 U/L (ref 38–126)
ALT: 17 U/L (ref 14–54)
ANION GAP: 6 (ref 5–15)
AST: 23 U/L (ref 15–41)
Albumin: 3.9 g/dL (ref 3.5–5.0)
BILIRUBIN TOTAL: 0.4 mg/dL (ref 0.3–1.2)
BUN: 13 mg/dL (ref 6–20)
CALCIUM: 9.2 mg/dL (ref 8.9–10.3)
CO2: 25 mmol/L (ref 22–32)
CREATININE: 0.9 mg/dL (ref 0.44–1.00)
Chloride: 109 mmol/L (ref 101–111)
Glucose, Bld: 169 mg/dL — ABNORMAL HIGH (ref 65–99)
Potassium: 3.5 mmol/L (ref 3.5–5.1)
SODIUM: 140 mmol/L (ref 135–145)
TOTAL PROTEIN: 7.3 g/dL (ref 6.5–8.1)

## 2016-07-24 LAB — CBC WITH DIFFERENTIAL/PLATELET
BASOS ABS: 0 10*3/uL (ref 0–0.1)
BASOS PCT: 0 %
EOS ABS: 0 10*3/uL (ref 0–0.7)
Eosinophils Relative: 0 %
HEMATOCRIT: 39.9 % (ref 35.0–47.0)
HEMOGLOBIN: 13.1 g/dL (ref 12.0–16.0)
Lymphocytes Relative: 14 %
Lymphs Abs: 0.9 10*3/uL — ABNORMAL LOW (ref 1.0–3.6)
MCH: 26.5 pg (ref 26.0–34.0)
MCHC: 32.9 g/dL (ref 32.0–36.0)
MCV: 80.4 fL (ref 80.0–100.0)
Monocytes Absolute: 0.1 10*3/uL — ABNORMAL LOW (ref 0.2–0.9)
Monocytes Relative: 1 %
NEUTROS ABS: 5.4 10*3/uL (ref 1.4–6.5)
NEUTROS PCT: 85 %
Platelets: 272 10*3/uL (ref 150–440)
RBC: 4.96 MIL/uL (ref 3.80–5.20)
RDW: 13.9 % (ref 11.5–14.5)
WBC: 6.4 10*3/uL (ref 3.6–11.0)

## 2016-07-24 MED ORDER — PALONOSETRON HCL INJECTION 0.25 MG/5ML
0.2500 mg | Freq: Once | INTRAVENOUS | Status: AC
  Administered 2016-07-24: 0.25 mg via INTRAVENOUS
  Filled 2016-07-24: qty 5

## 2016-07-24 MED ORDER — SODIUM CHLORIDE 0.9 % IV SOLN
Freq: Once | INTRAVENOUS | Status: AC
  Administered 2016-07-24: 10:00:00 via INTRAVENOUS
  Filled 2016-07-24: qty 1000

## 2016-07-24 MED ORDER — SODIUM CHLORIDE 0.9 % IV SOLN
600.0000 mg/m2 | Freq: Once | INTRAVENOUS | Status: AC
  Administered 2016-07-24: 1160 mg via INTRAVENOUS
  Filled 2016-07-24: qty 50
  Filled 2016-07-24: qty 58

## 2016-07-24 MED ORDER — DOCETAXEL CHEMO INJECTION 160 MG/16ML
75.0000 mg/m2 | Freq: Once | INTRAVENOUS | Status: AC
  Administered 2016-07-24: 140 mg via INTRAVENOUS
  Filled 2016-07-24: qty 14

## 2016-07-24 MED ORDER — PEGFILGRASTIM 6 MG/0.6ML ~~LOC~~ PSKT
6.0000 mg | PREFILLED_SYRINGE | Freq: Once | SUBCUTANEOUS | Status: AC
  Administered 2016-07-24: 6 mg via SUBCUTANEOUS
  Filled 2016-07-24: qty 0.6

## 2016-07-24 MED ORDER — SODIUM CHLORIDE 0.9 % IV SOLN
10.0000 mg | Freq: Once | INTRAVENOUS | Status: AC
  Administered 2016-07-24: 10 mg via INTRAVENOUS
  Filled 2016-07-24: qty 1

## 2016-07-24 NOTE — Progress Notes (Signed)
[place] NOTE  Patient Care Team: Birdie Sons, MD as PCP - General (Family Medicine) Robert Bellow, MD (General Surgery) Margarita Rana, MD as Referring Physician (Family Medicine)  CHIEF COMPLAINTS/PURPOSE OF CONSULTATION:  Breast cancer   #  Oncology History   # LEFT BREAST CANCER [pT1apN0] ER/PR/her 2 Neu- NEG. S/p Lumpec [Dr.Byrnett]; Mammosite [Dr.Crystal]; Mammaprint- 29% risk of recurrence; Recom TC x4.      Malignant neoplasm of upper-outer quadrant of left female breast ([place])   04/18/2016 Initial Diagnosis    Malignant neoplasm of upper-outer quadrant of left female breast (HCC)       HISTORY OF PRESENTING ILLNESS:  Theresa Acosta 65 y.o.  female with  Newly diagnosed breast cancer left-sided stage I triple negative; high risk MammaPrint is here to proceed with cycle #1 of Taxotere Cytoxan.   Denies any shortness of breath or cough. She took her steroids starting yesterday.. Denies any bone pain. Denies any hot flashes. No tingling or numbness.  ROS: A complete 10 point review of system is done which is negative except mentioned above in history of present illness  MEDICAL HISTORY:  Past Medical History:  Diagnosis Date  . Breast cancer of upper-outer quadrant of left female breast (HCC)    T1a, N0, triple negative, Mammoprint: High risk  . Cancer ([place])   . Personal history of colonic polyps 2007    SURGICAL HISTORY: Past Surgical History:  Procedure Laterality Date  . 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  . BREAST LUMPECTOMY WITH NEEDLE LOCALIZATION Left 05/26/2016   Procedure: BREAST LUMPECTOMY WITH SENTINEL LYMPH NODE BX and needle localization;  Surgeon: Robert Bellow, MD;  Location: [facility] ORS;  Service: General;  Laterality: Left;  . BREAST MAMMOSITE Left 06/26/2016  . BUNIONECTOMY  2012  . COLONOSCOPY  2007 ( serated adenoma of  the rectum), 2012 (normal)   Dr Bary Castilla  . COLONOSCOPY WITH PROPOFOL N/A 03/21/2016   Procedure: COLONOSCOPY WITH PROPOFOL;  Surgeon: Robert Bellow, MD;  Location[facility] ENDOSCOPY;  Service: Endoscopy;  Laterality: N/A;  . SENTINEL NODE BIOPSY Left 05/26/2016   Procedure: SENTINEL NODE BIOPSY;  Surgeon: Robert Bellow, MD;  Location: [facility] ORS;  Service: General;  Laterality: Left;  . 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 Marital status: Divorced    Spouse name: N/A  . Number of children: 2  . Years of education: N/A   Occupational History  . Not on file.   Social History Main Topics  . Smoking status: Former Smoker    Packs/day: 2.00    Years: 30.00    Quit date: 02/27/2016  . Smokeless tobacco: Never Used  . Alcohol use No  . Drug use: No  . Sexual activity: Not on file   Other Topics Concern  . Not on file   Social History Narrative  . No narrative on file    FAMILY HISTORY: no hx of cancer in family. Youngest of 12 siblings.  Family History  Problem Relation Age of Onset  . Healthy Sister   . Healthy Brother   . Healthy Sister   . Healthy Brother   . Healthy Brother   . Cancer Neg Hx   . Breast cancer Neg Hx     ALLERGIES:  has No Known Allergies.  MEDICATIONS:  Current Outpatient Prescriptions  Medication Sig Dispense Refill  . dexamethasone (DECADRON) 4 MG tablet Take 2 pills every 12 hours x 1 day; Start the day prior to chemo. Take it with food. 30 tablet 0  . ondansetron (ZOFRAN) 8 MG tablet Take 1 tablet (8 mg total) by mouth every 8 (eight) hours as needed for nausea or vomiting. (Patient not taking: Reported on 07/24/2016) 30 tablet 3  . prochlorperazine (COMPAZINE) 10 MG tablet Take 1 tablet (10 mg total) by mouth every 6 (six) hours as needed for nausea or vomiting. (Patient not taking: Reported on 07/24/2016) 30 tablet 3   No  current facility-administered medications for this visit.       Marland Kitchen  PHYSICAL EXAMINATION: ECOG PERFORMANCE STATUS: 0 - Asymptomatic  Vitals:   07/24/16 0855  BP: (!) 145/72  Pulse: 81  Resp: 17  Temp: 97.5 F (36.4 C)   Filed Weights   07/24/16 0855  Weight: 187 lb 3.2 oz (84.9 kg)    GENERAL: Well-nourished well-developed; Alert, no distress and comfortable.   Alone.  EYES: no pallor or icterus OROPHARYNX: no thrush or ulceration; good dentition  NECK: supple, no masses felt LYMPH:  no palpable lymphadenopathy in the cervical, axillary or inguinal regions LUNGS: clear to auscultation and  No wheeze or crackles HEART/CVS: regular rate & rhythm and no murmurs; No lower extremity edema ABDOMEN: abdomen soft, non-tender and normal bowel sounds Musculoskeletal:no cyanosis of digits and no clubbing  PSYCH: alert & oriented x 3 with fluent speech NEURO: no focal motor/sensory deficits SKIN:  no rashes or significant lesions  LABORATORY DATA:  I have reviewed the data as listed Lab Results  Component Value Date   WBC 6.4 07/24/2016   HGB 13.1 07/24/2016   HCT 39.9 07/24/2016   MCV 80.4 07/24/2016   PLT 272 07/24/2016    Recent Labs  06/27/16 1448 07/24/16 0842  NA 140 140  K 3.8 3.5  CL 106 109  CO2 29 25  GLUCOSE 104* 169*  BUN 16 13  CREATININE 0.86 0.90  CALCIUM 9.2 9.2  GFRNONAA >60 >60  GFRAA >60 >60  PROT 7.3 7.3  ALBUMIN 3.8 3.9  AST 18 23  ALT 23 17  ALKPHOS 87 78  BILITOT 0.6 0.4    RADIOGRAPHIC STUDIES: I have personally reviewed the radiological images as listed and agreed with the findings in the report. US Breast Complete Uni Left Inc Axilla  Result Date: 06/26/2016 The procedure was reviewed. The patient was amenable to proceed. The breast was prepped with alcohol and 10 mL of 0.5% Xylocaine with 0.25% Marcaine with 1-200,000 epinephrine was instilled. The area was then prepped with ChloraPrep and draped. An incision was made followed by  the 8 mm trocar. The cavity evaluation device had been pretested and easily inflated and desufflated with a spherical balloon. Once was placed in the patient however inflation became difficult after 2 mL of instilled. The balloon was worked back and the valve was nonfunctioning. The cavity evaluation device was set aside to return to the manufacturer.  The treatment balloon easily inflated and deflated to 60 mL. The balloon was deflated and placed in the position 9 cm from the skin. It was inflated with 40 mL of saline to which Omnipaque was added. Minimal spacing on transverse and longitudinal imaging was 1.4 cm from the skin. A spherical insufflation noted. The balloon did extend to the pectoralis fascia. No air pockets were evident.  Bacitracin ointment was placed on the exit site followed by bulky dressing.    ASSESSMENT & PLAN:   Malignant neoplasm of upper-outer quadrant of left female breast ([place]) Stage I triple negative breast cancer status post lumpectomy s/p MammoSite. High risk Mammoprint.   # proceed with adjuvant Taxotere+ Cytoxan- proceed with cycle # 1 today. Again educated the patient regarding the potential side effects of chemotherapy/prevention of side effects.   # plan labs- 10 days; follow up with me in 3 weeks/labs/chemo.  All questions were answered. The patient knows to call the clinic with any problems, questions or concerns.    Cammie Sickle, MD 07/25/2016 8:33 AM

## 2016-07-24 NOTE — Assessment & Plan Note (Addendum)
Stage I triple negative breast cancer status post lumpectomy s/p MammoSite. High risk Mammoprint.   # proceed with adjuvant Taxotere+ Cytoxan- proceed with cycle # 1 today. Again educated the patient regarding the potential side effects of chemotherapy/prevention of side effects.   # plan labs- 10 days; follow up with me in 3 weeks/labs/chemo.

## 2016-08-02 ENCOUNTER — Other Ambulatory Visit: Payer: Self-pay | Admitting: *Deleted

## 2016-08-02 ENCOUNTER — Encounter: Payer: Self-pay | Admitting: Radiation Oncology

## 2016-08-02 ENCOUNTER — Ambulatory Visit
Admission: RE | Admit: 2016-08-02 | Discharge: 2016-08-02 | Disposition: A | Discharge status: Home / Self Care | Payer: 59 | Source: Ambulatory Visit | Attending: Radiation Oncology | Admitting: Radiation Oncology

## 2016-08-02 ENCOUNTER — Inpatient Hospital Stay: Payer: 59 | Attending: Radiation Oncology

## 2016-08-02 VITALS — BP 132/70 | HR 81 | Temp 97.3°F | Resp 20 | Wt 184.2 lb

## 2016-08-02 DIAGNOSIS — C50412 Malignant neoplasm of upper-outer quadrant of left female breast: Secondary | ICD-10-CM | POA: Insufficient documentation

## 2016-08-02 DIAGNOSIS — Z171 Estrogen receptor negative status [ER-]: Secondary | ICD-10-CM | POA: Diagnosis not present

## 2016-08-02 DIAGNOSIS — Z79899 Other long term (current) drug therapy: Secondary | ICD-10-CM | POA: Insufficient documentation

## 2016-08-02 DIAGNOSIS — Z87891 Personal history of nicotine dependence: Secondary | ICD-10-CM | POA: Insufficient documentation

## 2016-08-02 DIAGNOSIS — M62838 Other muscle spasm: Secondary | ICD-10-CM

## 2016-08-02 DIAGNOSIS — Z5111 Encounter for antineoplastic chemotherapy: Secondary | ICD-10-CM | POA: Insufficient documentation

## 2016-08-02 DIAGNOSIS — Z7689 Persons encountering health services in other specified circumstances: Secondary | ICD-10-CM | POA: Diagnosis not present

## 2016-08-02 LAB — BASIC METABOLIC PANEL
ANION GAP: 5 (ref 5–15)
BUN: 7 mg/dL (ref 6–20)
CALCIUM: 8.5 mg/dL — AB (ref 8.9–10.3)
CHLORIDE: 106 mmol/L (ref 101–111)
CO2: 30 mmol/L (ref 22–32)
Creatinine, Ser: 0.7 mg/dL (ref 0.44–1.00)
GFR calc Af Amer: >60 mL/min (ref >60)
GFR calc non Af Amer: >60 mL/min (ref >60)
Glucose, Bld: 109 mg/dL — ABNORMAL HIGH (ref 65–99)
Potassium: 3.4 mmol/L — ABNORMAL LOW (ref 3.5–5.1)
Sodium: 141 mmol/L (ref 135–145)

## 2016-08-02 LAB — CBC WITH DIFFERENTIAL/PLATELET
BASOS ABS: 0.1 10*3/uL (ref 0–0.1)
Basophils Relative: 1 %
Eosinophils Absolute: 0 10*3/uL (ref 0–0.7)
Eosinophils Relative: 0 %
HEMATOCRIT: 35.5 % (ref 35.0–47.0)
HEMOGLOBIN: 11.5 g/dL — AB (ref 12.0–16.0)
LYMPHS PCT: 9 %
Lymphs Abs: 1.7 10*3/uL (ref 1.0–3.6)
MCH: 26 pg (ref 26.0–34.0)
MCHC: 32.5 g/dL (ref 32.0–36.0)
MCV: 79.9 fL — AB (ref 80.0–100.0)
MONO ABS: 0.7 10*3/uL (ref 0.2–0.9)
Monocytes Relative: 4 %
NEUTROS ABS: 16.1 10*3/uL — AB (ref 1.4–6.5)
Neutrophils Relative %: 86 %
Platelets: 212 10*3/uL (ref 150–440)
RBC: 4.44 MIL/uL (ref 3.80–5.20)
RDW: 13.4 % (ref 11.5–14.5)
WBC: 18.7 10*3/uL — ABNORMAL HIGH (ref 3.6–11.0)

## 2016-08-02 MED ORDER — CYCLOBENZAPRINE HCL 5 MG PO TABS: 5.0000 mg | ORAL_TABLET | Freq: Every day | ORAL | Status: DC

## 2016-08-02 MED ORDER — CYCLOBENZAPRINE HCL 5 MG PO TABS: 5.0000 mg | ORAL_TABLET | Freq: Three times a day (TID) | ORAL | 0 refills | Status: DC | PRN

## 2016-08-02 NOTE — Progress Notes (Signed)
Radiation Oncology Follow up Note  Name: Theresa Acosta   Date:   08/02/2016 MRN:  [id number] DOB: 06-06-1951    This 65 y.o. female presents to the clinic today for one-month follow-up for invasive mammary carcinoma of the left breast status post accelerated partial breast radiation for stage I lesion ER/PR negative HER-2/neu not overexpressed.  REFERRING PROVIDER: Margarita Rana, MD  HPI: Patient is a 65 year old female now out 1 month having completed accelerated partial breast radiation to her left breast for stage I (T1 N0 M0) ER/PR negative HER-2/neu negative invasive mammary carcinoma status post wide local excision and sentinel node biopsy. She is seen today in routine follow-up she is doing well. She specifically denies breast tenderness cough or bone pain. Based on ER/PR negativity she is not on her antiestrogen therapy.. Her MammaPrint was high risk she is currently starting Taxotere and Cytoxan chemotherapy under medical oncology's direction.  COMPLICATIONS OF TREATMENT: none  FOLLOW UP COMPLIANCE: keeps appointments   PHYSICAL EXAM:  BP 132/70   Pulse 81   Temp 97.3 F (36.3 C)   Resp 20   Wt 184 lb 3.1 oz (83.6 kg)   BMI 32.63 kg/m  Lungs are clear to A&P cardiac examination essentially unremarkable with regular rate and rhythm. No dominant mass or nodularity is noted in either breast in 2 positions examined. Incision is well-healed. No axillary or supraclavicular adenopathy is appreciated. Cosmetic result is excellent. Well-developed well-nourished patient in NAD. HEENT reveals PERLA, EOMI, discs not visualized.  Oral cavity is clear. No oral mucosal lesions are identified. Neck is clear without evidence of cervical or supraclavicular adenopathy. Lungs are clear to A&P. Cardiac examination is essentially unremarkable with regular rate and rhythm without murmur rub or thrill. Abdomen is benign with no organomegaly or masses noted. Motor sensory and DTR levels are equal and  symmetric in the upper and lower extremities. Cranial nerves II through XII are grossly intact. Proprioception is intact. No peripheral adenopathy or edema is identified. No motor or sensory levels are noted. Crude visual fields are within normal range.  RADIOLOGY RESULTS: No current films for review  PLAN: At this time she's completed her radiation therapy via accelerated partial breast radiation will continue with systemic chemotherapy under medical oncology's direction. I am please were overall progress. I've asked to see her back in 4-5 months for follow-up. She knows to call sooner with any concerns.  I would like to take this opportunity to thank you for allowing me to participate in the care of your patient.Armstead Peaks., MD

## 2016-08-03 ENCOUNTER — Inpatient Hospital Stay: Payer: 59

## 2016-08-09 ENCOUNTER — Telehealth: Payer: Self-pay | Admitting: *Deleted

## 2016-08-09 NOTE — Telephone Encounter (Signed)
Asking if she can get the flu shot at her work

## 2016-08-09 NOTE — Telephone Encounter (Signed)
OK per Dr Tish Men to get flu shot. Patient informed left msg on VM

## 2016-08-14 ENCOUNTER — Encounter: Payer: Self-pay | Admitting: Internal Medicine

## 2016-08-14 ENCOUNTER — Inpatient Hospital Stay (HOSPITAL_BASED_OUTPATIENT_CLINIC_OR_DEPARTMENT_OTHER): Payer: 59 | Admitting: Internal Medicine

## 2016-08-14 ENCOUNTER — Inpatient Hospital Stay: Payer: 59

## 2016-08-14 VITALS — BP 124/80 | HR 73 | Temp 96.9°F | Resp 15 | Ht 63.0 in | Wt 184.8 lb

## 2016-08-14 VITALS — BP 135/82 | HR 73 | Temp 97.0°F | Resp 16

## 2016-08-14 DIAGNOSIS — Z171 Estrogen receptor negative status [ER-]: Secondary | ICD-10-CM

## 2016-08-14 DIAGNOSIS — C50412 Malignant neoplasm of upper-outer quadrant of left female breast: Secondary | ICD-10-CM

## 2016-08-14 DIAGNOSIS — Z87891 Personal history of nicotine dependence: Secondary | ICD-10-CM | POA: Diagnosis not present

## 2016-08-14 DIAGNOSIS — Z79899 Other long term (current) drug therapy: Secondary | ICD-10-CM

## 2016-08-14 DIAGNOSIS — Z7689 Persons encountering health services in other specified circumstances: Secondary | ICD-10-CM | POA: Diagnosis not present

## 2016-08-14 DIAGNOSIS — Z5111 Encounter for antineoplastic chemotherapy: Secondary | ICD-10-CM | POA: Diagnosis not present

## 2016-08-14 LAB — COMPREHENSIVE METABOLIC PANEL
ALK PHOS: 65 U/L (ref 38–126)
ALT: 11 U/L — ABNORMAL LOW (ref 14–54)
ANION GAP: 7 (ref 5–15)
AST: 25 U/L (ref 15–41)
Albumin: 3.5 g/dL (ref 3.5–5.0)
BILIRUBIN TOTAL: 0.6 mg/dL (ref 0.3–1.2)
BUN: 13 mg/dL (ref 6–20)
CALCIUM: 9 mg/dL (ref 8.9–10.3)
CO2: 21 mmol/L — ABNORMAL LOW (ref 22–32)
Chloride: 112 mmol/L — ABNORMAL HIGH (ref 101–111)
Creatinine, Ser: 0.68 mg/dL (ref 0.44–1.00)
Glucose, Bld: 169 mg/dL — ABNORMAL HIGH (ref 65–99)
POTASSIUM: 3.5 mmol/L (ref 3.5–5.1)
Sodium: 140 mmol/L (ref 135–145)
TOTAL PROTEIN: 6.9 g/dL (ref 6.5–8.1)

## 2016-08-14 LAB — CBC WITH DIFFERENTIAL/PLATELET
Basophils Absolute: 0 10*3/uL (ref 0–0.1)
Basophils Relative: 1 %
Eosinophils Absolute: 0 10*3/uL (ref 0–0.7)
Eosinophils Relative: 0 %
HEMATOCRIT: 37.6 % (ref 35.0–47.0)
HEMOGLOBIN: 12.3 g/dL (ref 12.0–16.0)
LYMPHS ABS: 0.6 10*3/uL — AB (ref 1.0–3.6)
Lymphocytes Relative: 13 %
MCH: 26.6 pg (ref 26.0–34.0)
MCHC: 32.8 g/dL (ref 32.0–36.0)
MCV: 81.2 fL (ref 80.0–100.0)
MONO ABS: 0.1 10*3/uL — AB (ref 0.2–0.9)
MONOS PCT: 2 %
NEUTROS ABS: 4.1 10*3/uL (ref 1.4–6.5)
NEUTROS PCT: 84 %
Platelets: 501 10*3/uL — ABNORMAL HIGH (ref 150–440)
RBC: 4.64 MIL/uL (ref 3.80–5.20)
RDW: 14 % (ref 11.5–14.5)
WBC: 4.8 10*3/uL (ref 3.6–11.0)

## 2016-08-14 MED ORDER — SODIUM CHLORIDE 0.9% FLUSH
10.0000 mL | INTRAVENOUS | Status: DC | PRN
  Administered 2016-08-14: 10 mL
  Filled 2016-08-14: qty 10

## 2016-08-14 MED ORDER — DEXAMETHASONE SODIUM PHOSPHATE 100 MG/10ML IJ SOLN: 10.0000 mg | Freq: Once | INTRAMUSCULAR | Status: DC

## 2016-08-14 MED ORDER — SODIUM CHLORIDE 0.9 % IV SOLN
600.0000 mg/m2 | Freq: Once | INTRAVENOUS | Status: AC
  Administered 2016-08-14: 1160 mg via INTRAVENOUS
  Filled 2016-08-14: qty 25

## 2016-08-14 MED ORDER — DEXAMETHASONE SODIUM PHOSPHATE 10 MG/ML IJ SOLN
10.0000 mg | Freq: Once | INTRAMUSCULAR | Status: AC
  Administered 2016-08-14: 10 mg via INTRAVENOUS
  Filled 2016-08-14: qty 1

## 2016-08-14 MED ORDER — PALONOSETRON HCL INJECTION 0.25 MG/5ML
0.2500 mg | Freq: Once | INTRAVENOUS | Status: AC
  Administered 2016-08-14: 0.25 mg via INTRAVENOUS
  Filled 2016-08-14: qty 5

## 2016-08-14 MED ORDER — PEGFILGRASTIM 6 MG/0.6ML ~~LOC~~ PSKT
6.0000 mg | PREFILLED_SYRINGE | Freq: Once | SUBCUTANEOUS | Status: AC
  Administered 2016-08-14: 6 mg via SUBCUTANEOUS
  Filled 2016-08-14: qty 0.6

## 2016-08-14 MED ORDER — SODIUM CHLORIDE 0.9 % IV SOLN
Freq: Once | INTRAVENOUS | Status: AC
  Administered 2016-08-14: 10:00:00 via INTRAVENOUS
  Filled 2016-08-14: qty 1000

## 2016-08-14 MED ORDER — HEPARIN SOD (PORK) LOCK FLUSH 100 UNIT/ML IV SOLN: 500.0000 [IU] | Freq: Once | INTRAVENOUS | Status: DC | PRN

## 2016-08-14 MED ORDER — DOCETAXEL CHEMO INJECTION 160 MG/16ML
75.0000 mg/m2 | Freq: Once | INTRAVENOUS | Status: AC
  Administered 2016-08-14: 140 mg via INTRAVENOUS
  Filled 2016-08-14: qty 14

## 2016-08-14 NOTE — Progress Notes (Signed)
Started flexeril for muscle spasms

## 2016-08-14 NOTE — Progress Notes (Signed)
[place] NOTE  Patient Care Team: Birdie Sons, MD as PCP - General (Family Medicine) Robert Bellow, MD (General Surgery) Margarita Rana, MD as Referring Physician (Family Medicine)  CHIEF COMPLAINTS/PURPOSE OF CONSULTATION:  Breast cancer   #  Oncology History   # LEFT BREAST CANCER [pT1apN0] ER/PR/her 2 Neu- NEG. S/p Lumpec [Dr.Byrnett]; Mammosite [Dr.Crystal]; Mammaprint- 29% risk of recurrence; Recom TC x4.      Carcinoma of upper-outer quadrant of left breast in female, estrogen receptor negative ([place])   04/18/2016 Initial Diagnosis    Malignant neoplasm of upper-outer quadrant of left female breast (HCC)       HISTORY OF PRESENTING ILLNESS:  Tequilla Dugan Maiers 65 y.o.  female with  Newly diagnosed breast cancer left-sided stage I triple negative; high risk MammaPrint is here to proceed with cycle #2 of Chemotherapy with Taxotere Cytoxan.   Patient complains of mild to moderate body aches/muscle pain after cycle #1 of chemotherapy. Improved. Denies any significant nausea vomiting or chest pain or cough. No tingling and numbness. No swelling in the legs.  ROS: A complete 10 point review of system is done which is negative except mentioned above in history of present illness  MEDICAL HISTORY:  Past Medical History:  Diagnosis Date  . Breast cancer of upper-outer quadrant of left female breast (HCC)    T1a, N0, triple negative, Mammoprint: High risk  . Cancer ([place])   . Personal history of colonic polyps 2007    SURGICAL HISTORY: Past Surgical History:  Procedure Laterality Date  . 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  . BREAST LUMPECTOMY WITH NEEDLE LOCALIZATION Left 05/26/2016   Procedure: BREAST LUMPECTOMY WITH SENTINEL LYMPH NODE BX and needle localization;  Surgeon: Robert Bellow, MD;  Location: [facility] ORS;  Service: General;  Laterality: Left;   . BREAST MAMMOSITE Left 06/26/2016  . BUNIONECTOMY  2012  . COLONOSCOPY  2007 ( serated adenoma of the rectum), 2012 (normal)   Dr Bary Castilla  . COLONOSCOPY WITH PROPOFOL N/A 03/21/2016   Procedure: COLONOSCOPY WITH PROPOFOL;  Surgeon: Robert Bellow, MD;  Location: [facility] ENDOSCOPY;  Service: Endoscopy;  Laterality: N/A;  . SENTINEL NODE BIOPSY Left 05/26/2016   Procedure: SENTINEL NODE BIOPSY;  Surgeon: Robert Bellow, MD;  Location: [facility] ORS;  Service: General;  Laterality: Left;  . 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 Marital status: Divorced    Spouse name: N/A  . Number of children: 2  . Years of education: N/A   Occupational History  . Not on file.   Social History Main Topics  . Smoking status: Former Smoker    Packs/day: 2.00    Years: 30.00    Quit date: 02/27/2016  . Smokeless tobacco: Never Used  . Alcohol use No  . Drug use: No  . Sexual activity: Not on file   Other Topics Concern  . Not on file   Social History Narrative  . No narrative on file    FAMILY HISTORY: no hx of cancer in family. Youngest of 12 siblings.  Family History  Problem Relation Age of Onset  . Healthy Sister   . Healthy Brother   . Healthy Sister   . Healthy Brother   . Healthy Brother   . Cancer Neg  Hx   . Breast cancer Neg Hx     ALLERGIES:  has No Known Allergies.  MEDICATIONS:  Current Outpatient Prescriptions  Medication Sig Dispense Refill  . cyclobenzaprine (FLEXERIL) 5 MG tablet Take 1 tablet (5 mg total) by mouth 3 (three) times daily as needed for muscle spasms. 30 tablet 0  . dexamethasone (DECADRON) 4 MG tablet Take 2 pills every 12 hours x 1 day; Start the day prior to chemo. Take it with food. 30 tablet 0  . ondansetron (ZOFRAN) 8 MG tablet Take 1 tablet (8 mg total) by mouth every 8 (eight) hours as needed for nausea or vomiting. 30 tablet  3  . prochlorperazine (COMPAZINE) 10 MG tablet Take 1 tablet (10 mg total) by mouth every 6 (six) hours as needed for nausea or vomiting. 30 tablet 3   No current facility-administered medications for this visit.       Marland Kitchen  PHYSICAL EXAMINATION: ECOG PERFORMANCE STATUS: 0 - Asymptomatic  Vitals:   08/14/16 0855  BP: 124/80  Pulse: 73  Resp: 15  Temp: (!) 96.9 F (36.1 C)   Filed Weights   08/14/16 0855  Weight: 184 lb 12.8 oz (83.8 kg)    GENERAL: Well-nourished well-developed; Alert, no distress and comfortable.   Alone.  EYES: no pallor or icterus OROPHARYNX: no thrush or ulceration; good dentition  NECK: supple, no masses felt LYMPH:  no palpable lymphadenopathy in the cervical, axillary or inguinal regions LUNGS: clear to auscultation and  No wheeze or crackles HEART/CVS: regular rate & rhythm and no murmurs; No lower extremity edema ABDOMEN: abdomen soft, non-tender and normal bowel sounds Musculoskeletal:no cyanosis of digits and no clubbing  PSYCH: alert & oriented x 3 with fluent speech NEURO: no focal motor/sensory deficits SKIN:  no rashes or significant lesions  LABORATORY DATA:  I have reviewed the data as listed Lab Results  Component Value Date   WBC 4.8 08/14/2016   HGB 12.3 08/14/2016   HCT 37.6 08/14/2016   MCV 81.2 08/14/2016   PLT 501 (H) 08/14/2016    Recent Labs  06/27/16 1448 07/24/16 0842 08/02/16 1000 08/14/16 0835  NA 140 140 141 140  K 3.8 3.5 3.4* 3.5  CL 106 109 106 112*  CO2 29 25 30  21*  GLUCOSE 104* 169* 109* 169*  BUN 16 13 7 13   CREATININE 0.86 0.90 0.70 0.68  CALCIUM 9.2 9.2 8.5* 9.0  GFRNONAA >60 >60 >60 >60  GFRAA >60 >60 >60 >60  PROT 7.3 7.3  --  6.9  ALBUMIN 3.8 3.9  --  3.5  AST 18 23  --  25  ALT 23 17  --  11*  ALKPHOS 87 78  --  65  BILITOT 0.6 0.4  --  0.6    RADIOGRAPHIC STUDIES: I have personally reviewed the radiological images as listed and agreed with the findings in the report. No results  found.  ASSESSMENT & PLAN:   Carcinoma of upper-outer quadrant of left breast in female, estrogen receptor negative ([place]) Stage I triple negative breast cancer status post lumpectomy s/p MammoSite. High risk Mammoprint. S/p cycle #1 of TC.   # proceed with adjuvant Taxotere+ Cytoxan- proceed with cycle # 2  Today. Labs- reviewed adequate.   # myalgias/bone pain sec to neulasta.   # plan labs- 10 days; follow up with me in 3 weeks/labs/chemo.  All questions were answered. The patient knows to call the clinic with any problems, questions or concerns.    Lenetta Quaker  Ann Lions, MD 08/14/2016 5:40 PM

## 2016-08-14 NOTE — Assessment & Plan Note (Addendum)
Stage I triple negative breast cancer status post lumpectomy s/p MammoSite. High risk Mammoprint. S/p cycle #1 of TC.   # proceed with adjuvant Taxotere+ Cytoxan- proceed with cycle # 2  Today. Labs- reviewed adequate.   # myalgias/bone pain sec to neulasta.   # plan labs- 10 days; follow up with me in 3 weeks/labs/chemo.

## 2016-08-24 ENCOUNTER — Inpatient Hospital Stay: Payer: 59

## 2016-08-24 DIAGNOSIS — Z7689 Persons encountering health services in other specified circumstances: Secondary | ICD-10-CM | POA: Diagnosis not present

## 2016-08-24 DIAGNOSIS — Z5111 Encounter for antineoplastic chemotherapy: Secondary | ICD-10-CM | POA: Diagnosis not present

## 2016-08-24 DIAGNOSIS — Z79899 Other long term (current) drug therapy: Secondary | ICD-10-CM | POA: Diagnosis not present

## 2016-08-24 DIAGNOSIS — C50412 Malignant neoplasm of upper-outer quadrant of left female breast: Secondary | ICD-10-CM

## 2016-08-24 DIAGNOSIS — Z171 Estrogen receptor negative status [ER-]: Secondary | ICD-10-CM | POA: Diagnosis not present

## 2016-08-24 DIAGNOSIS — Z87891 Personal history of nicotine dependence: Secondary | ICD-10-CM | POA: Diagnosis not present

## 2016-08-24 LAB — CBC WITH DIFFERENTIAL/PLATELET
BASOS PCT: 1 %
Basophils Absolute: 0.1 10*3/uL (ref 0–0.1)
EOS ABS: 0 10*3/uL (ref 0–0.7)
EOS PCT: 0 %
HCT: 35.6 % (ref 35.0–47.0)
HEMOGLOBIN: 11.6 g/dL — AB (ref 12.0–16.0)
Lymphocytes Relative: 9 %
Lymphs Abs: 1.4 10*3/uL (ref 1.0–3.6)
MCH: 26.4 pg (ref 26.0–34.0)
MCHC: 32.7 g/dL (ref 32.0–36.0)
MCV: 80.8 fL (ref 80.0–100.0)
MONOS PCT: 6 %
Monocytes Absolute: 0.8 10*3/uL (ref 0.2–0.9)
NEUTROS PCT: 84 %
Neutro Abs: 12.4 10*3/uL — ABNORMAL HIGH (ref 1.4–6.5)
PLATELETS: 250 10*3/uL (ref 150–440)
RBC: 4.41 MIL/uL (ref 3.80–5.20)
RDW: 14.3 % (ref 11.5–14.5)
WBC: 14.7 10*3/uL — ABNORMAL HIGH (ref 3.6–11.0)

## 2016-08-24 LAB — BASIC METABOLIC PANEL
Anion gap: 8 (ref 5–15)
BUN: 8 mg/dL (ref 6–20)
CALCIUM: 8.5 mg/dL — AB (ref 8.9–10.3)
CO2: 27 mmol/L (ref 22–32)
CREATININE: 0.72 mg/dL (ref 0.44–1.00)
Chloride: 105 mmol/L (ref 101–111)
Glucose, Bld: 95 mg/dL (ref 65–99)
Potassium: 3.7 mmol/L (ref 3.5–5.1)
SODIUM: 140 mmol/L (ref 135–145)

## 2016-09-04 ENCOUNTER — Inpatient Hospital Stay: Payer: 59

## 2016-09-04 ENCOUNTER — Inpatient Hospital Stay: Payer: 59 | Attending: Internal Medicine | Admitting: Internal Medicine

## 2016-09-04 VITALS — BP 148/74 | HR 67 | Temp 96.9°F | Resp 18 | Wt 182.8 lb

## 2016-09-04 DIAGNOSIS — Z923 Personal history of irradiation: Secondary | ICD-10-CM | POA: Diagnosis not present

## 2016-09-04 DIAGNOSIS — C50412 Malignant neoplasm of upper-outer quadrant of left female breast: Secondary | ICD-10-CM

## 2016-09-04 DIAGNOSIS — Z87891 Personal history of nicotine dependence: Secondary | ICD-10-CM | POA: Diagnosis not present

## 2016-09-04 DIAGNOSIS — Z171 Estrogen receptor negative status [ER-]: Secondary | ICD-10-CM | POA: Diagnosis not present

## 2016-09-04 DIAGNOSIS — Z79899 Other long term (current) drug therapy: Secondary | ICD-10-CM | POA: Diagnosis not present

## 2016-09-04 DIAGNOSIS — Z5111 Encounter for antineoplastic chemotherapy: Secondary | ICD-10-CM | POA: Insufficient documentation

## 2016-09-04 LAB — CBC WITH DIFFERENTIAL/PLATELET
BASOS ABS: 0 10*3/uL (ref 0–0.1)
BASOS PCT: 1 %
EOS ABS: 0 10*3/uL (ref 0–0.7)
Eosinophils Relative: 0 %
HEMATOCRIT: 35.9 % (ref 35.0–47.0)
HEMOGLOBIN: 12 g/dL (ref 12.0–16.0)
Lymphocytes Relative: 18 %
Lymphs Abs: 0.7 10*3/uL — ABNORMAL LOW (ref 1.0–3.6)
MCH: 26.9 pg (ref 26.0–34.0)
MCHC: 33.3 g/dL (ref 32.0–36.0)
MCV: 80.9 fL (ref 80.0–100.0)
MONOS PCT: 3 %
Monocytes Absolute: 0.1 10*3/uL — ABNORMAL LOW (ref 0.2–0.9)
NEUTROS ABS: 3.3 10*3/uL (ref 1.4–6.5)
NEUTROS PCT: 78 %
Platelets: 430 10*3/uL (ref 150–440)
RBC: 4.44 MIL/uL (ref 3.80–5.20)
RDW: 14.8 % — ABNORMAL HIGH (ref 11.5–14.5)
WBC: 4.1 10*3/uL (ref 3.6–11.0)

## 2016-09-04 LAB — COMPREHENSIVE METABOLIC PANEL
ALBUMIN: 3.5 g/dL (ref 3.5–5.0)
ALK PHOS: 73 U/L (ref 38–126)
ALT: 13 U/L — ABNORMAL LOW (ref 14–54)
ANION GAP: 7 (ref 5–15)
AST: 19 U/L (ref 15–41)
BILIRUBIN TOTAL: 0.2 mg/dL — AB (ref 0.3–1.2)
BUN: 11 mg/dL (ref 6–20)
CALCIUM: 9.3 mg/dL (ref 8.9–10.3)
CO2: 22 mmol/L (ref 22–32)
Chloride: 111 mmol/L (ref 101–111)
Creatinine, Ser: 0.53 mg/dL (ref 0.44–1.00)
GFR calc Af Amer: >60 mL/min (ref >60)
GFR calc non Af Amer: >60 mL/min (ref >60)
GLUCOSE: 152 mg/dL — AB (ref 65–99)
Potassium: 3.7 mmol/L (ref 3.5–5.1)
SODIUM: 140 mmol/L (ref 135–145)
Total Protein: 6.8 g/dL (ref 6.5–8.1)

## 2016-09-04 MED ORDER — SODIUM CHLORIDE 0.9 % IV SOLN
600.0000 mg/m2 | Freq: Once | INTRAVENOUS | Status: AC
  Administered 2016-09-04: 1160 mg via INTRAVENOUS
  Filled 2016-09-04: qty 50

## 2016-09-04 MED ORDER — DOCETAXEL CHEMO INJECTION 160 MG/16ML
75.0000 mg/m2 | Freq: Once | INTRAVENOUS | Status: DC
  Filled 2016-09-04: qty 14

## 2016-09-04 MED ORDER — SODIUM CHLORIDE 0.9% FLUSH
10.0000 mL | INTRAVENOUS | Status: DC | PRN
  Filled 2016-09-04: qty 10

## 2016-09-04 MED ORDER — SODIUM CHLORIDE 0.9 % IV SOLN
Freq: Once | INTRAVENOUS | Status: AC
  Administered 2016-09-04: 11:00:00 via INTRAVENOUS
  Filled 2016-09-04: qty 1000

## 2016-09-04 MED ORDER — SODIUM CHLORIDE 0.9 % IV SOLN
75.0000 mg/m2 | Freq: Once | INTRAVENOUS | Status: AC
  Administered 2016-09-04: 140 mg via INTRAVENOUS
  Filled 2016-09-04: qty 14

## 2016-09-04 MED ORDER — SODIUM CHLORIDE 0.9 % IV SOLN: 10.0000 mg | Freq: Once | INTRAVENOUS | Status: DC

## 2016-09-04 MED ORDER — DEXAMETHASONE SODIUM PHOSPHATE 10 MG/ML IJ SOLN
10.0000 mg | Freq: Once | INTRAMUSCULAR | Status: AC
  Administered 2016-09-04: 10 mg via INTRAVENOUS
  Filled 2016-09-04: qty 1

## 2016-09-04 MED ORDER — CYCLOBENZAPRINE HCL 5 MG PO TABS: 5.0000 mg | ORAL_TABLET | Freq: Three times a day (TID) | ORAL | 0 refills | Status: DC | PRN

## 2016-09-04 MED ORDER — PALONOSETRON HCL INJECTION 0.25 MG/5ML
0.2500 mg | Freq: Once | INTRAVENOUS | Status: AC
  Administered 2016-09-04: 0.25 mg via INTRAVENOUS
  Filled 2016-09-04: qty 5

## 2016-09-04 MED ORDER — PEGFILGRASTIM 6 MG/0.6ML ~~LOC~~ PSKT
6.0000 mg | PREFILLED_SYRINGE | Freq: Once | SUBCUTANEOUS | Status: AC
  Administered 2016-09-04: 6 mg via SUBCUTANEOUS
  Filled 2016-09-04: qty 0.6

## 2016-09-04 MED ORDER — HEPARIN SOD (PORK) LOCK FLUSH 100 UNIT/ML IV SOLN: 500.0000 [IU] | Freq: Once | INTRAVENOUS | Status: DC | PRN

## 2016-09-04 NOTE — Assessment & Plan Note (Signed)
Stage I triple negative breast cancer status post lumpectomy s/p MammoSite. High risk Mammoprint. S/p cycle #2 of TC. Tolerating chemo well.   # proceed with adjuvant Taxotere+ Cytoxan- proceed with cycle # 3  Today. Labs- reviewed adequate.   # myalgias/bone pain sec to neulasta; will refill flexeril.  # plan labs- 10 days; follow up with me in 3 weeks/labs/chemo.

## 2016-09-04 NOTE — Progress Notes (Signed)
[place] NOTE  Patient Care Team: Birdie Sons, MD as PCP - General (Family Medicine) Robert Bellow, MD (General Surgery) Margarita Rana, MD as Referring Physician (Family Medicine)  CHIEF COMPLAINTS/PURPOSE OF CONSULTATION:  Breast cancer   #  Oncology History   # LEFT BREAST CANCER [pT1apN0] ER/PR/her 2 Neu- NEG. S/p Lumpec [Dr.Byrnett]; Mammosite [Dr.Crystal]; Mammaprint- 29% risk of recurrence; Recom TC x4.      Carcinoma of upper-outer quadrant of left breast in female, estrogen receptor negative ([place])   04/18/2016 Initial Diagnosis    Malignant neoplasm of upper-outer quadrant of left female breast (HCC)       HISTORY OF PRESENTING ILLNESS:  Theresa Acosta 65 y.o.  female with  Newly diagnosed breast cancer left-sided stage I triple negative; high risk MammaPrint is here to proceed with cycle #3 of Chemotherapy with Taxotere Cytoxan.   Patient complains of mild to moderate body aches/muscle pain after cycle #2 of chemotherapy. Improved. Denies any significant nausea vomiting or chest pain or cough. No tingling and numbness. No swelling in the legs.  ROS: A complete 10 point review of system is done which is negative except mentioned above in history of present illness  MEDICAL HISTORY:  Past Medical History:  Diagnosis Date  . Breast cancer of upper-outer quadrant of left female breast (HCC)    T1a, N0, triple negative, Mammoprint: High risk  . Cancer ([place])   . Personal history of colonic polyps 2007    SURGICAL HISTORY: Past Surgical History:  Procedure Laterality Date  . 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  . BREAST LUMPECTOMY WITH NEEDLE LOCALIZATION Left 05/26/2016   Procedure: BREAST LUMPECTOMY WITH SENTINEL LYMPH NODE BX and needle localization;  Surgeon: Robert Bellow, MD;  Location: [facility] ORS;  Service: General;  Laterality: Left;   . BREAST MAMMOSITE Left 06/26/2016  . BUNIONECTOMY  2012  . COLONOSCOPY  2007 ( serated adenoma of the rectum), 2012 (normal)   Dr Bary Castilla  . COLONOSCOPY WITH PROPOFOL N/A 03/21/2016   Procedure: COLONOSCOPY WITH PROPOFOL;  Surgeon: Robert Bellow, MD;  Location: [facility] ENDOSCOPY;  Service: Endoscopy;  Laterality: N/A;  . SENTINEL NODE BIOPSY Left 05/26/2016   Procedure: SENTINEL NODE BIOPSY;  Surgeon: Robert Bellow, MD;  Location: [facility] ORS;  Service: General;  Laterality: Left;  . 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 Marital status: Divorced    Spouse name: N/A  . Number of children: 2  . Years of education: N/A   Occupational History  . Not on file.   Social History Main Topics  . Smoking status: Former Smoker    Packs/day: 2.00    Years: 30.00    Quit date: 02/27/2016  . Smokeless tobacco: Never Used  . Alcohol use No  . Drug use: No  . Sexual activity: Not on file   Other Topics Concern  . Not on file   Social History Narrative  . No narrative on file    FAMILY HISTORY: no hx of cancer in family. Youngest of 12 siblings.  Family History  Problem Relation Age of Onset  . Healthy Sister   . Healthy Brother   . Healthy Sister   . Healthy Brother   . Healthy Brother   . Cancer Neg  Hx   . Breast cancer Neg Hx     ALLERGIES:  has No Known Allergies.  MEDICATIONS:  Current Outpatient Prescriptions  Medication Sig Dispense Refill  . cyclobenzaprine (FLEXERIL) 5 MG tablet Take 1 tablet (5 mg total) by mouth 3 (three) times daily as needed for muscle spasms. 30 tablet 0  . dexamethasone (DECADRON) 4 MG tablet Take 2 pills every 12 hours x 1 day; Start the day prior to chemo. Take it with food. 30 tablet 0  . loratadine (CLARITIN) 10 MG tablet Take 10 mg by mouth daily.    . ondansetron (ZOFRAN) 8 MG tablet Take 1 tablet (8 mg total) by  mouth every 8 (eight) hours as needed for nausea or vomiting. 30 tablet 3  . prochlorperazine (COMPAZINE) 10 MG tablet Take 1 tablet (10 mg total) by mouth every 6 (six) hours as needed for nausea or vomiting. 30 tablet 3   No current facility-administered medications for this visit.       Marland Kitchen  PHYSICAL EXAMINATION: ECOG PERFORMANCE STATUS: 0 - Asymptomatic  Vitals:   09/04/16 0919  BP: (!) 148/74  Pulse: 67  Resp: 18  Temp: (!) 96.9 F (36.1 C)   Filed Weights   09/04/16 0919  Weight: 182 lb 12.8 oz (82.9 kg)    GENERAL: Well-nourished well-developed; Alert, no distress and comfortable.   Alone.  EYES: no pallor or icterus OROPHARYNX: no thrush or ulceration; good dentition  NECK: supple, no masses felt LYMPH:  no palpable lymphadenopathy in the cervical, axillary or inguinal regions LUNGS: clear to auscultation and  No wheeze or crackles HEART/CVS: regular rate & rhythm and no murmurs; No lower extremity edema ABDOMEN: abdomen soft, non-tender and normal bowel sounds Musculoskeletal:no cyanosis of digits and no clubbing  PSYCH: alert & oriented x 3 with fluent speech NEURO: no focal motor/sensory deficits SKIN:  no rashes or significant lesions  LABORATORY DATA:  I have reviewed the data as listed Lab Results  Component Value Date   WBC 4.1 09/04/2016   HGB 12.0 09/04/2016   HCT 35.9 09/04/2016   MCV 80.9 09/04/2016   PLT 430 09/04/2016    Recent Labs  07/24/16 0842  08/14/16 0835 08/24/16 0950 09/04/16 0844  NA 140  < > 140 140 140  K 3.5  < > 3.5 3.7 3.7  CL 109  < > 112* 105 111  CO2 25  < > 21* 27 22  GLUCOSE 169*  < > 169* 95 152*  BUN 13  < > 13 8 11   CREATININE 0.90  < > 0.68 0.72 0.53  CALCIUM 9.2  < > 9.0 8.5* 9.3  GFRNONAA >60  < > >60 >60 >60  GFRAA >60  < > >60 >60 >60  PROT 7.3  --  6.9  --  6.8  ALBUMIN 3.9  --  3.5  --  3.5  AST 23  --  25  --  19  ALT 17  --  11*  --  13*  ALKPHOS 78  --  65  --  73  BILITOT 0.4  --  0.6  --  0.2*   < > = values in this interval not displayed.  RADIOGRAPHIC STUDIES: I have personally reviewed the radiological images as listed and agreed with the findings in the report. No results found.  ASSESSMENT & PLAN:   Carcinoma of upper-outer quadrant of left breast in female, estrogen receptor negative ([place]) Stage I triple negative breast cancer status post lumpectomy  s/p MammoSite. High risk Mammoprint. S/p cycle #2 of TC. Tolerating chemo well.   # proceed with adjuvant Taxotere+ Cytoxan- proceed with cycle # 3  Today. Labs- reviewed adequate.   # myalgias/bone pain sec to neulasta; will refill flexeril.  # plan labs- 10 days; follow up with me in 3 weeks/labs/chemo.  All questions were answered. The patient knows to call the clinic with any problems, questions or concerns.    Cammie Sickle, MD 09/05/2016 7:55 AM

## 2016-09-04 NOTE — Progress Notes (Signed)
Patient is here for follow up, she would like to get a refill on her flexeril

## 2016-09-14 ENCOUNTER — Inpatient Hospital Stay: Payer: 59

## 2016-09-14 DIAGNOSIS — Z171 Estrogen receptor negative status [ER-]: Secondary | ICD-10-CM | POA: Diagnosis not present

## 2016-09-14 DIAGNOSIS — Z87891 Personal history of nicotine dependence: Secondary | ICD-10-CM | POA: Diagnosis not present

## 2016-09-14 DIAGNOSIS — C50412 Malignant neoplasm of upper-outer quadrant of left female breast: Secondary | ICD-10-CM

## 2016-09-14 DIAGNOSIS — Z79899 Other long term (current) drug therapy: Secondary | ICD-10-CM | POA: Diagnosis not present

## 2016-09-14 DIAGNOSIS — Z923 Personal history of irradiation: Secondary | ICD-10-CM | POA: Diagnosis not present

## 2016-09-14 DIAGNOSIS — Z5111 Encounter for antineoplastic chemotherapy: Secondary | ICD-10-CM | POA: Diagnosis not present

## 2016-09-14 LAB — CBC WITH DIFFERENTIAL/PLATELET
BASOS ABS: 0.1 10*3/uL (ref 0–0.1)
Basophils Relative: 1 %
Eosinophils Absolute: 0 10*3/uL (ref 0–0.7)
Eosinophils Relative: 0 %
HCT: 33.7 % — ABNORMAL LOW (ref 35.0–47.0)
HEMOGLOBIN: 10.8 g/dL — AB (ref 12.0–16.0)
LYMPHS ABS: 1.6 10*3/uL (ref 1.0–3.6)
LYMPHS PCT: 8 %
MCH: 25.9 pg — AB (ref 26.0–34.0)
MCHC: 32.1 g/dL (ref 32.0–36.0)
MCV: 80.8 fL (ref 80.0–100.0)
Monocytes Absolute: 1 10*3/uL — ABNORMAL HIGH (ref 0.2–0.9)
Monocytes Relative: 5 %
NEUTROS PCT: 86 %
Neutro Abs: 17.9 10*3/uL — ABNORMAL HIGH (ref 1.4–6.5)
Platelets: 308 10*3/uL (ref 150–440)
RBC: 4.17 MIL/uL (ref 3.80–5.20)
RDW: 14.9 % — ABNORMAL HIGH (ref 11.5–14.5)
WBC: 20.7 10*3/uL — AB (ref 3.6–11.0)

## 2016-09-14 LAB — BASIC METABOLIC PANEL
ANION GAP: 5 (ref 5–15)
BUN: 7 mg/dL (ref 6–20)
CHLORIDE: 106 mmol/L (ref 101–111)
CO2: 29 mmol/L (ref 22–32)
Calcium: 8.2 mg/dL — ABNORMAL LOW (ref 8.9–10.3)
Creatinine, Ser: 0.73 mg/dL (ref 0.44–1.00)
GFR calc Af Amer: >60 mL/min (ref >60)
GFR calc non Af Amer: >60 mL/min (ref >60)
GLUCOSE: 111 mg/dL — AB (ref 65–99)
POTASSIUM: 3 mmol/L — AB (ref 3.5–5.1)
SODIUM: 140 mmol/L (ref 135–145)

## 2016-09-19 ENCOUNTER — Telehealth: Payer: Self-pay | Admitting: *Deleted

## 2016-09-19 NOTE — Telephone Encounter (Signed)
Please inform pt to try hydrocortisone cream BID; see if that helps. Thx

## 2016-09-19 NOTE — Telephone Encounter (Signed)
Patient advised of md advice

## 2016-09-19 NOTE — Telephone Encounter (Signed)
Called to report that the hand she had her chemo in is black on top and is itching. She had her last chemo 11/6 and is due her next on 11/27. Please advise

## 2016-09-25 ENCOUNTER — Inpatient Hospital Stay: Payer: 59

## 2016-09-25 ENCOUNTER — Inpatient Hospital Stay (HOSPITAL_BASED_OUTPATIENT_CLINIC_OR_DEPARTMENT_OTHER): Payer: 59 | Admitting: Internal Medicine

## 2016-09-25 VITALS — BP 128/68 | HR 67 | Temp 97.6°F | Resp 18

## 2016-09-25 VITALS — BP 120/77 | HR 80 | Temp 97.8°F | Resp 18 | Wt 181.6 lb

## 2016-09-25 DIAGNOSIS — Z923 Personal history of irradiation: Secondary | ICD-10-CM | POA: Diagnosis not present

## 2016-09-25 DIAGNOSIS — Z171 Estrogen receptor negative status [ER-]: Secondary | ICD-10-CM | POA: Diagnosis not present

## 2016-09-25 DIAGNOSIS — Z79899 Other long term (current) drug therapy: Secondary | ICD-10-CM | POA: Diagnosis not present

## 2016-09-25 DIAGNOSIS — Z87891 Personal history of nicotine dependence: Secondary | ICD-10-CM | POA: Diagnosis not present

## 2016-09-25 DIAGNOSIS — C50412 Malignant neoplasm of upper-outer quadrant of left female breast: Secondary | ICD-10-CM

## 2016-09-25 DIAGNOSIS — Z5111 Encounter for antineoplastic chemotherapy: Secondary | ICD-10-CM | POA: Diagnosis not present

## 2016-09-25 LAB — CBC WITH DIFFERENTIAL/PLATELET
Basophils Absolute: 0 10*3/uL (ref 0–0.1)
Basophils Relative: 0 %
EOS ABS: 0 10*3/uL (ref 0–0.7)
EOS PCT: 0 %
HCT: 35.4 % (ref 35.0–47.0)
Hemoglobin: 11.6 g/dL — ABNORMAL LOW (ref 12.0–16.0)
LYMPHS ABS: 0.7 10*3/uL — AB (ref 1.0–3.6)
Lymphocytes Relative: 15 %
MCH: 26.7 pg (ref 26.0–34.0)
MCHC: 32.7 g/dL (ref 32.0–36.0)
MCV: 81.5 fL (ref 80.0–100.0)
Monocytes Absolute: 0.1 10*3/uL — ABNORMAL LOW (ref 0.2–0.9)
Monocytes Relative: 3 %
Neutro Abs: 3.6 10*3/uL (ref 1.4–6.5)
Neutrophils Relative %: 82 %
Platelets: 436 10*3/uL (ref 150–440)
RBC: 4.35 MIL/uL (ref 3.80–5.20)
RDW: 15.6 % — AB (ref 11.5–14.5)
WBC: 4.4 10*3/uL (ref 3.6–11.0)

## 2016-09-25 LAB — COMPREHENSIVE METABOLIC PANEL
ALT: 12 U/L — AB (ref 14–54)
ANION GAP: 9 (ref 5–15)
AST: 20 U/L (ref 15–41)
Albumin: 3.5 g/dL (ref 3.5–5.0)
Alkaline Phosphatase: 64 U/L (ref 38–126)
BUN: 13 mg/dL (ref 6–20)
CHLORIDE: 109 mmol/L (ref 101–111)
CO2: 23 mmol/L (ref 22–32)
Calcium: 9.1 mg/dL (ref 8.9–10.3)
Creatinine, Ser: 0.65 mg/dL (ref 0.44–1.00)
GFR calc non Af Amer: >60 mL/min (ref >60)
Glucose, Bld: 112 mg/dL — ABNORMAL HIGH (ref 65–99)
POTASSIUM: 3.6 mmol/L (ref 3.5–5.1)
SODIUM: 141 mmol/L (ref 135–145)
Total Bilirubin: 0.4 mg/dL (ref 0.3–1.2)
Total Protein: 6.8 g/dL (ref 6.5–8.1)

## 2016-09-25 MED ORDER — DOCETAXEL CHEMO INJECTION 160 MG/16ML
75.0000 mg/m2 | Freq: Once | INTRAVENOUS | Status: AC
  Administered 2016-09-25: 140 mg via INTRAVENOUS
  Filled 2016-09-25: qty 14

## 2016-09-25 MED ORDER — SODIUM CHLORIDE 0.9 % IV SOLN
Freq: Once | INTRAVENOUS | Status: AC
  Administered 2016-09-25: 10:00:00 via INTRAVENOUS
  Filled 2016-09-25: qty 1000

## 2016-09-25 MED ORDER — CYCLOPHOSPHAMIDE CHEMO INJECTION 1 GM
600.0000 mg/m2 | Freq: Once | INTRAMUSCULAR | Status: AC
  Administered 2016-09-25: 1160 mg via INTRAVENOUS
  Filled 2016-09-25: qty 50

## 2016-09-25 MED ORDER — DEXAMETHASONE SODIUM PHOSPHATE 10 MG/ML IJ SOLN
10.0000 mg | Freq: Once | INTRAMUSCULAR | Status: AC
  Administered 2016-09-25: 10 mg via INTRAVENOUS
  Filled 2016-09-25: qty 1

## 2016-09-25 MED ORDER — PEGFILGRASTIM 6 MG/0.6ML ~~LOC~~ PSKT
6.0000 mg | PREFILLED_SYRINGE | Freq: Once | SUBCUTANEOUS | Status: AC
  Administered 2016-09-25: 6 mg via SUBCUTANEOUS
  Filled 2016-09-25: qty 0.6

## 2016-09-25 MED ORDER — PALONOSETRON HCL INJECTION 0.25 MG/5ML
0.2500 mg | Freq: Once | INTRAVENOUS | Status: AC
  Administered 2016-09-25: 0.25 mg via INTRAVENOUS
  Filled 2016-09-25: qty 5

## 2016-09-25 MED ORDER — SODIUM CHLORIDE 0.9 % IV SOLN: 10.0000 mg | Freq: Once | INTRAVENOUS | Status: DC

## 2016-09-25 NOTE — Progress Notes (Signed)
The[place] NOTE  Patient Care Team: Birdie Sons, Theresa as PCP - General (Family Medicine) Robert Acosta, Theresa Acosta, Theresa Acosta]; Mammosite [Dr.Crystal]; Mammaprint- 29% risk of recurrence; Recom TC x4.      Carcinoma of upper-outer quadrant of left breast in female, estrogen receptor negative ([place])   04/18/2016 Initial Diagnosis    Malignant neoplasm of upper-outer quadrant of left female breast (HCC)       HISTORY OF PRESENTING ILLNESS:  In Theresa Acosta 65 y.o.  female with  Newly diagnosed breast cancer left-sided stage I triple negative; high risk MammaPrint is here to proceed with cycle #4 of Chemotherapy with Taxotere Cytoxan.   Patient admits to improvement of body aches and joint pains post chemotherapy. Patient has been taking Flexeril and Claritin as needed. Denies any significant nausea vomiting or chest pain or cough. No tingling and numbness. No swelling in the legs.  ROS: A complete 10 point review of system is done which is negative except mentioned above in history of present illness  MEDICAL HISTORY:  Past Medical History:  Diagnosis Date  . Breast cancer of upper-outer quadrant of left female breast (HCC)    T1a, N0, triple negative, Mammoprint: High risk  . Cancer ([place])   . Personal history of colonic polyps 2007    SURGICAL HISTORY: Past Surgical History:  Procedure Laterality Date  . 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  . BREAST LUMPECTOMY WITH NEEDLE LOCALIZATION Left 05/26/2016   Procedure: BREAST LUMPECTOMY WITH SENTINEL LYMPH NODE BX and needle localization;  Surgeon: Robert Bellow, Theresa;  Location: [facility] ORS;   Service: General;  Laterality: Left;  . BREAST MAMMOSITE Left 06/26/2016  . BUNIONECTOMY  2012  . COLONOSCOPY  2007 ( serated adenoma of the rectum), 2012 (normal)   Dr Bary Castilla  . COLONOSCOPY WITH PROPOFOL N/A 03/21/2016   Procedure: COLONOSCOPY WITH PROPOFOL;  Surgeon: Robert Bellow, Theresa;  Location: [facility] ENDOSCOPY;  Service: Endoscopy;  Laterality: N/A;  . SENTINEL NODE BIOPSY Left 05/26/2016   Procedure: SENTINEL NODE BIOPSY;  Surgeon: Robert Bellow, Theresa;  Location: [facility] ORS;  Service: General;  Laterality: Left;  . 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 Marital status: Divorced    Spouse name: N/A  . Number of children: 2  . Years of education: N/A   Occupational History  . Not on file.   Social History Main Topics  . Smoking status: Former Smoker    Packs/day: 2.00    Years: 30.00    Quit date: 02/27/2016  . Smokeless tobacco: Never Used  . Alcohol use No  . Drug use: No  . Sexual activity: Not on file   Other Topics Concern  . Not on file   Social History Narrative  . No narrative on file    FAMILY HISTORY: no hx of cancer in family. Youngest of 12 siblings.  Family History  Problem Relation Age of Onset  . Healthy Sister   . Healthy Brother   . Healthy Sister   . Healthy Brother   . Healthy  Brother   . Cancer Neg Hx   . Breast cancer Neg Hx     ALLERGIES:  has No Known Allergies.  MEDICATIONS:  Current Outpatient Prescriptions  Medication Sig Dispense Refill  . cyclobenzaprine (FLEXERIL) 5 MG tablet Take 1 tablet (5 mg total) by mouth 3 (three) times daily as needed for muscle spasms. 30 tablet 0  . dexamethasone (DECADRON) 4 MG tablet Take 2 pills every 12 hours x 1 day; Start the day prior to chemo. Take it with food. 30 tablet 0  . loratadine (CLARITIN) 10 MG tablet Take 10 mg by mouth daily.    . ondansetron (ZOFRAN) 8 MG  tablet Take 1 tablet (8 mg total) by mouth every 8 (eight) hours as needed for nausea or vomiting. 30 tablet 3  . prochlorperazine (COMPAZINE) 10 MG tablet Take 1 tablet (10 mg total) by mouth every 6 (six) hours as needed for nausea or vomiting. 30 tablet 3   No current facility-administered medications for this visit.       Marland Kitchen  PHYSICAL EXAMINATION: ECOG PERFORMANCE STATUS: 0 - Asymptomatic  Vitals:   09/25/16 0936  BP: 120/77  Pulse: 80  Resp: 18  Temp: 97.8 F (36.6 C)   Filed Weights   09/25/16 0936  Weight: 181 lb 9.6 oz (82.4 kg)    GENERAL: Well-nourished well-developed; Alert, no distress and comfortable.   Alone.  EYES: no pallor or icterus OROPHARYNX: no thrush or ulceration; good dentition  NECK: supple, no masses felt LYMPH:  no palpable lymphadenopathy in the cervical, axillary or inguinal regions LUNGS: clear to auscultation and  No wheeze or crackles HEART/CVS: regular rate & rhythm and no murmurs; No lower extremity edema ABDOMEN: abdomen soft, non-tender and normal bowel sounds Musculoskeletal:no cyanosis of digits and no clubbing  PSYCH: alert & oriented x 3 with fluent speech NEURO: no focal motor/sensory deficits SKIN:  no rashes or significant lesions  LABORATORY DATA:  I have reviewed the data as listed Lab Results  Component Value Date   WBC 4.4 09/25/2016   HGB 11.6 (L) 09/25/2016   HCT 35.4 09/25/2016   MCV 81.5 09/25/2016   PLT 436 09/25/2016    Recent Labs  08/14/16 0835  09/04/16 0844 09/14/16 0946 09/25/16 0855  NA 140  < > 140 140 141  K 3.5  < > 3.7 3.0* 3.6  CL 112*  < > 111 106 109  CO2 21*  < > 22 29 23   GLUCOSE 169*  < > 152* 111* 112*  BUN 13  < > 11 7 13   CREATININE 0.68  < > 0.53 0.73 0.65  CALCIUM 9.0  < > 9.3 8.2* 9.1  GFRNONAA >60  < > >60 >60 >60  GFRAA >60  < > >60 >60 >60  PROT 6.9  --  6.8  --  6.8  ALBUMIN 3.5  --  3.5  --  3.5  AST 25  --  19  --  20  ALT 11*  --  13*  --  12*  ALKPHOS 65  --  73  --   64  BILITOT 0.6  --  0.2*  --  0.4  < > = values in this interval not displayed.  RADIOGRAPHIC STUDIES: I have personally reviewed the radiological images as listed and agreed with the findings in the report. No results found.  ASSESSMENT & PLAN:   Carcinoma of upper-outer quadrant of left breast in female, estrogen receptor negative ([place]) Stage I triple negative  breast cancer status post lumpectomy s/p MammoSite. High risk Mammoprint. S/p cycle #3 of TC. Tolerating chemo well.   # proceed with adjuvant Taxotere+ Cytoxan- proceed with cycle # 4  Today. Labs- reviewed adequate.   # myalgias/bone pain sec to neulasta- improved;  Continue flexeril/ clairtin.  # plan labs- 10 days; follow up with me in 6 weeks/labs/chemo.  All questions were answered. The patient knows to call the clinic with any problems, questions or concerns.    Cammie Sickle, Theresa 09/26/2016 8:20 AM

## 2016-09-25 NOTE — Assessment & Plan Note (Signed)
Stage I triple negative breast cancer status post lumpectomy s/p MammoSite. High risk Mammoprint. S/p cycle #3 of TC. Tolerating chemo well.   # proceed with adjuvant Taxotere+ Cytoxan- proceed with cycle # 4  Today. Labs- reviewed adequate.   # myalgias/bone pain sec to neulasta- improved;  Continue flexeril/ clairtin.  # plan labs- 10 days; follow up with me in 6 weeks/labs/chemo.

## 2016-09-25 NOTE — Progress Notes (Signed)
Patient is here for follow up, she is doing very well

## 2016-09-28 ENCOUNTER — Other Ambulatory Visit: Payer: Self-pay

## 2016-09-28 ENCOUNTER — Ambulatory Visit: Payer: 59 | Admitting: Physician Assistant

## 2016-09-28 ENCOUNTER — Emergency Department
Admission: EM | Admit: 2016-09-28 | Discharge: 2016-09-28 | Disposition: A | Discharge status: Home / Self Care | Payer: 59 | Attending: Emergency Medicine | Admitting: Emergency Medicine

## 2016-09-28 ENCOUNTER — Telehealth: Payer: Self-pay | Admitting: *Deleted

## 2016-09-28 DIAGNOSIS — R42 Dizziness and giddiness: Secondary | ICD-10-CM | POA: Diagnosis not present

## 2016-09-28 DIAGNOSIS — Z79899 Other long term (current) drug therapy: Secondary | ICD-10-CM | POA: Insufficient documentation

## 2016-09-28 DIAGNOSIS — Z853 Personal history of malignant neoplasm of breast: Secondary | ICD-10-CM | POA: Diagnosis not present

## 2016-09-28 DIAGNOSIS — Z87891 Personal history of nicotine dependence: Secondary | ICD-10-CM | POA: Insufficient documentation

## 2016-09-28 LAB — CBC
HCT: 39.2 % (ref 35.0–47.0)
Hemoglobin: 12.5 g/dL (ref 12.0–16.0)
MCH: 26.3 pg (ref 26.0–34.0)
MCHC: 32 g/dL (ref 32.0–36.0)
MCV: 82.2 fL (ref 80.0–100.0)
PLATELETS: 380 10*3/uL (ref 150–440)
RBC: 4.76 MIL/uL (ref 3.80–5.20)
RDW: 15.2 % — ABNORMAL HIGH (ref 11.5–14.5)
WBC: 5.8 10*3/uL (ref 3.6–11.0)

## 2016-09-28 LAB — BASIC METABOLIC PANEL
Anion gap: 7 (ref 5–15)
BUN: 12 mg/dL (ref 6–20)
CHLORIDE: 103 mmol/L (ref 101–111)
CO2: 27 mmol/L (ref 22–32)
CREATININE: 0.62 mg/dL (ref 0.44–1.00)
Calcium: 8.7 mg/dL — ABNORMAL LOW (ref 8.9–10.3)
Glucose, Bld: 122 mg/dL — ABNORMAL HIGH (ref 65–99)
Potassium: 3.3 mmol/L — ABNORMAL LOW (ref 3.5–5.1)
SODIUM: 137 mmol/L (ref 135–145)

## 2016-09-28 MED ORDER — SODIUM CHLORIDE 0.9 % IV BOLUS (SEPSIS)
1000.0000 mL | Freq: Once | INTRAVENOUS | Status: AC
  Administered 2016-09-28: 1000 mL via INTRAVENOUS

## 2016-09-28 NOTE — Discharge Instructions (Signed)
You have been seen today in the Emergency Department (ED)  for lightheadedness.  Your workup including labs and EKG show reassuring results.  Your symptoms may be due to very mild dehydration, so it is important that you drink plenty of non-alcoholic fluids.  Please call your regular doctor as soon as possible to schedule the next available clinic appointment to follow up with him/her regarding your visit to the ED and your symptoms.  Return to the Emergency Department (ED)  if you have any further syncopal episodes (pass out again) or develop ANY chest pain, pressure, tightness, trouble breathing, sudden sweating, or other symptoms that concern you.

## 2016-09-28 NOTE — ED Provider Notes (Signed)
[facility] Emergency Department Provider Note  ____________________________________________   First MD Initiated Contact with Patient 09/28/16 1558     (approximate)  I have reviewed the triage vital signs and the nursing notes.   HISTORY  Chief Complaint Dizziness    HPI Theresa Acosta is a 65 y.o. female with a history of breast cancer who just completed chemotherapy three days agoand who has completed radiation treatments as well who presents for evaluation of lightheadedness and breaking out into a sweat earlier today.  The patient says that she had therapy on Monday (today is Thursday), and that Wednesday's are always her "bad day" after chemotherapy.  Today she was feeling a little bit better but did not have anything to eat or drink today before she decided to go out on a shopping trip.  While she was out and about she feels like she may have overexerted herself and started to feel lightheaded and sweaty.  She denies chest pain, shortness of breath, abdominal pain, nausea, vomiting.  She has had normal urination and bowel movements today.  She reports that she wanted to go home and rest but her daughter wanted her to come be evaluated.  She reports that the symptoms were gradual in onset, moderate at their worst, and now are much better after a liter of fluids and appeared depressed in the emergency department.   Past Medical History:  Diagnosis Date  . Breast cancer of upper-outer quadrant of left female breast (HCC)    T1a, N0, triple negative, Mammoprint: High risk  . Cancer ([place])   . Personal history of colonic polyps 2007    Patient Active Problem List   Diagnosis Date Noted  . Breast mass, right 05/09/2016  . Personal history of tobacco use, presenting hazards to health 04/20/2016  . Carcinoma of upper-outer quadrant of left breast in female, estrogen receptor negative ([place]) 04/18/2016  . Blood in the urine 03/29/2016  .  Hypercholesteremia 03/29/2016  . Blood glucose elevated 03/29/2016  . Adiposity 03/29/2016  . Avitaminosis D 03/29/2016  . History of colonic polyps 03/03/2016  . History of tobacco use 04/22/2008    Past Surgical History:  Procedure Laterality Date  . 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  . BREAST LUMPECTOMY WITH NEEDLE LOCALIZATION Left 05/26/2016   Procedure: BREAST LUMPECTOMY WITH SENTINEL LYMPH NODE BX and needle localization;  Surgeon: Robert Bellow, MD;  Location: [facility] ORS;  Service: General;  Laterality: Left;  . BREAST MAMMOSITE Left 06/26/2016  . BUNIONECTOMY  2012  . COLONOSCOPY  2007 ( serated adenoma of the rectum), 2012 (normal)   Dr Bary Castilla  . COLONOSCOPY WITH PROPOFOL N/A 03/21/2016   Procedure: COLONOSCOPY WITH PROPOFOL;  Surgeon: Robert Bellow, MD;  Location: [facility] ENDOSCOPY;  Service: Endoscopy;  Laterality: N/A;  . SENTINEL NODE BIOPSY Left 05/26/2016   Procedure: SENTINEL NODE BIOPSY;  Surgeon: Robert Bellow, MD;  Location: [facility] ORS;  Service: General;  Laterality: Left;  . TUBAL LIGATION  1977    Prior to Admission medications   Medication Sig Start Date End Date Taking? Authorizing Provider  cyclobenzaprine (FLEXERIL) 5 MG tablet Take 1 tablet (5 mg total) by mouth 3 (three) times daily as needed for muscle spasms. 09/04/16   Cammie Sickle, MD  dexamethasone (DECADRON) 4 MG tablet Take 2 pills every 12 hours x 1 day; Start the day prior to chemo. Take it with  food. 06/27/16   Cammie Sickle, MD  loratadine (CLARITIN) 10 MG tablet Take 10 mg by mouth daily.    Historical Provider, MD  ondansetron (ZOFRAN) 8 MG tablet Take 1 tablet (8 mg total) by mouth every 8 (eight) hours as needed for nausea or vomiting. 06/27/16   Cammie Sickle, MD  prochlorperazine (COMPAZINE) 10 MG tablet Take 1 tablet (10 mg total) by mouth every 6 (six) hours as needed for  nausea or vomiting. 06/27/16   Cammie Sickle, MD    Allergies Patient has no known allergies.  Family History  Problem Relation Age of Onset  . Healthy Sister   . Healthy Brother   . Healthy Sister   . Healthy Brother   . Healthy Brother   . Cancer Neg Hx   . Breast cancer Neg Hx     Social History Social History  Substance Use Topics  . Smoking status: Former Smoker    Packs/day: 2.00    Years: 30.00    Quit date: 02/27/2016  . Smokeless tobacco: Never Used  . Alcohol use No    Review of Systems Constitutional: No fever/chills Eyes: No visual changes. ENT: No sore throat. Cardiovascular: Denies chest pain. Respiratory: Denies shortness of breath. Gastrointestinal: No abdominal pain.  No nausea, no vomiting.  No diarrhea.  No constipation. Genitourinary: Negative for dysuria. Musculoskeletal: Negative for back pain. Skin: Negative for rash. Neurological: Negative for headaches, focal weakness or numbness.  Brief episode Of light headedness  10-point ROS otherwise negative.  ____________________________________________   PHYSICAL EXAM:  VITAL SIGNS: ED Triage Vitals [09/28/16 1339]  Enc Vitals Group     BP (!) 119/58     Pulse Rate (!) 102     Resp 18     Temp 98.6 F (37 C)     Temp Source Oral     SpO2 96 %     Weight 181 lb (82.1 kg)     Height 5\' 3"  (1.6 m)     Head Circumference      Peak Flow      Pain Score 0     Pain Loc      Pain Edu?      Excl. in [place]?     Constitutional: Alert and oriented. Well appearing and in no acute distress. Eyes: Conjunctivae are normal. PERRL. EOMI. Head: Atraumatic. Nose: No congestion/rhinnorhea. Mouth/Throat: Mucous membranes are moist.  Oropharynx non-erythematous. Neck: No stridor.  No meningeal signs.   Cardiovascular: Normal rate, regular rhythm. Good peripheral circulation. Grossly normal heart sounds. Respiratory: Normal respiratory effort.  No retractions. Lungs CTAB. Gastrointestinal: Soft and  nontender. No distention.  Musculoskeletal: No lower extremity tenderness nor edema. No gross deformities of extremities. Neurologic:  Normal speech and language. No gross focal neurologic deficits are appreciated.  Skin:  Skin is warm, dry and intact. No rash noted. Psychiatric: Mood and affect are normal. Speech and behavior are normal.  ____________________________________________   LABS (all labs ordered are listed, but only abnormal results are displayed)  Labs Reviewed  BASIC METABOLIC PANEL - Abnormal; Notable for the following:       Result Value   Potassium 3.3 (*)    Glucose, Bld 122 (*)    Calcium 8.7 (*)    All other components within normal limits  CBC - Abnormal; Notable for the following:    RDW 15.2 (*)    All other components within normal limits  URINALYSIS COMPLETEWITH MICROSCOPIC ([facility] ONLY)  CBG MONITORING,  ED   ____________________________________________  EKG  ED ECG REPORT I, Kip Kautzman, the attending physician, personally viewed and interpreted this ECG.  Date: 09/28/2016 EKG Time: 13:44 Rate: 100 Rhythm: borderline sinus tachycardia QRS Axis: normal Intervals: normal ST/T Wave abnormalities: normal Conduction Disturbances: none Narrative Interpretation: unremarkable  ____________________________________________  RADIOLOGY   No results found.  ____________________________________________   PROCEDURES  Procedure(s) performed:   Procedures   Critical Care performed: No ____________________________________________   INITIAL IMPRESSION / ASSESSMENT AND PLAN / ED COURSE  Pertinent labs & imaging results that were available during my care of the patient were reviewed by me and considered in my medical decision making (see chart for details).  The patient's lab results are reassuring as are her vital signs.  She states that she feels "so much better" her some rest and a liter of fluids.  We discussed how I believe she likely  just overexerted herself today after her final chemotherapy treatment and especially when she did not eat or drink anything before going out.  She is going to go home and get something to eat and follow up with her regular doctors as needed.  No evidence of acute or emergent medical condition.  I gave my usual and customary return precautions.     ____________________________________________  FINAL CLINICAL IMPRESSION(S) / ED DIAGNOSES  Final diagnoses:  Lightheadedness     MEDICATIONS GIVEN DURING THIS VISIT:  Medications  sodium chloride 0.9 % bolus 1,000 mL (0 mLs Intravenous Stopped 09/28/16 1712)     NEW OUTPATIENT MEDICATIONS STARTED DURING THIS VISIT:  New Prescriptions   No medications on file    Modified Medications   No medications on file    Discontinued Medications   No medications on file     Note:  This document was prepared using Dragon voice recognition software and may include unintentional dictation errors.    Hinda Kehr, MD 09/28/16 [phone]

## 2016-09-28 NOTE — ED Notes (Signed)
Sent from cancer center

## 2016-09-28 NOTE — Telephone Encounter (Signed)
Called to report that she fainted in public and that she is dizzy still. She wants her evaluated here and not to have to take her to the ER. Per Dr B, advised her to go to ER where she can be treated faster. She finally agreed to take her to ER.

## 2016-09-28 NOTE — ED Triage Notes (Signed)
Pt c/o having sudden onset dizziness and breaking out into a sweat, skin is warm and dry on arrival.. Pt had last chemo treatment last Monday and doctor believes it may be related to dehydration.Marland Kitchen

## 2016-10-05 ENCOUNTER — Telehealth: Payer: Self-pay | Admitting: *Deleted

## 2016-10-05 ENCOUNTER — Inpatient Hospital Stay: Payer: 59 | Attending: Internal Medicine

## 2016-10-05 DIAGNOSIS — C50412 Malignant neoplasm of upper-outer quadrant of left female breast: Secondary | ICD-10-CM | POA: Diagnosis not present

## 2016-10-05 DIAGNOSIS — Z171 Estrogen receptor negative status [ER-]: Secondary | ICD-10-CM | POA: Diagnosis not present

## 2016-10-05 DIAGNOSIS — Z923 Personal history of irradiation: Secondary | ICD-10-CM | POA: Diagnosis not present

## 2016-10-05 LAB — BASIC METABOLIC PANEL
Anion gap: 5 (ref 5–15)
BUN: 10 mg/dL (ref 6–20)
CHLORIDE: 103 mmol/L (ref 101–111)
CO2: 31 mmol/L (ref 22–32)
CREATININE: 0.62 mg/dL (ref 0.44–1.00)
Calcium: 8.3 mg/dL — ABNORMAL LOW (ref 8.9–10.3)
GFR calc non Af Amer: >60 mL/min (ref >60)
Glucose, Bld: 111 mg/dL — ABNORMAL HIGH (ref 65–99)
POTASSIUM: 3.1 mmol/L — AB (ref 3.5–5.1)
Sodium: 139 mmol/L (ref 135–145)

## 2016-10-05 LAB — CBC WITH DIFFERENTIAL/PLATELET
Basophils Absolute: 0 10*3/uL (ref 0–0.1)
Basophils Relative: 2 %
Eosinophils Absolute: 0 10*3/uL (ref 0–0.7)
Eosinophils Relative: 1 %
HCT: 33.1 % — ABNORMAL LOW (ref 35.0–47.0)
Hemoglobin: 10.8 g/dL — ABNORMAL LOW (ref 12.0–16.0)
Lymphocytes Relative: 56 %
Lymphs Abs: 0.8 10*3/uL — ABNORMAL LOW (ref 1.0–3.6)
MCH: 26.4 pg (ref 26.0–34.0)
MCHC: 32.8 g/dL (ref 32.0–36.0)
MCV: 80.7 fL (ref 80.0–100.0)
Monocytes Absolute: 0.5 10*3/uL (ref 0.2–0.9)
Monocytes Relative: 35 %
Neutro Abs: 0.1 10*3/uL — ABNORMAL LOW (ref 1.4–6.5)
Neutrophils Relative %: 6 %
Platelets: 394 10*3/uL (ref 150–440)
RBC: 4.1 MIL/uL (ref 3.80–5.20)
RDW: 15 % — ABNORMAL HIGH (ref 11.5–14.5)
WBC: 1.4 10*3/uL — CL (ref 3.6–11.0)

## 2016-10-05 NOTE — Telephone Encounter (Signed)
Penny in cancer ctr lab called critical value to [employer], Therapist, sports in cancer ctr. anc 0.1 and wbc 1.4.  Read back process performed with lab tech.  Read back process performed with Dr. Rogue Bussing at [phone] for critical value.

## 2016-11-06 ENCOUNTER — Inpatient Hospital Stay: Payer: 59 | Attending: Internal Medicine | Admitting: Internal Medicine

## 2016-11-06 ENCOUNTER — Inpatient Hospital Stay: Payer: 59

## 2016-11-06 DIAGNOSIS — Z79899 Other long term (current) drug therapy: Secondary | ICD-10-CM | POA: Insufficient documentation

## 2016-11-06 DIAGNOSIS — Z171 Estrogen receptor negative status [ER-]: Secondary | ICD-10-CM | POA: Insufficient documentation

## 2016-11-06 DIAGNOSIS — Z9221 Personal history of antineoplastic chemotherapy: Secondary | ICD-10-CM | POA: Diagnosis not present

## 2016-11-06 DIAGNOSIS — C50412 Malignant neoplasm of upper-outer quadrant of left female breast: Secondary | ICD-10-CM | POA: Insufficient documentation

## 2016-11-06 DIAGNOSIS — Z87891 Personal history of nicotine dependence: Secondary | ICD-10-CM | POA: Diagnosis not present

## 2016-11-06 LAB — COMPREHENSIVE METABOLIC PANEL
ALT: 26 U/L (ref 14–54)
ANION GAP: 4 — AB (ref 5–15)
AST: 30 U/L (ref 15–41)
Albumin: 3.4 g/dL — ABNORMAL LOW (ref 3.5–5.0)
Alkaline Phosphatase: 64 U/L (ref 38–126)
BUN: 12 mg/dL (ref 6–20)
CHLORIDE: 109 mmol/L (ref 101–111)
CO2: 28 mmol/L (ref 22–32)
CREATININE: 0.61 mg/dL (ref 0.44–1.00)
Calcium: 8.7 mg/dL — ABNORMAL LOW (ref 8.9–10.3)
Glucose, Bld: 119 mg/dL — ABNORMAL HIGH (ref 65–99)
POTASSIUM: 3.2 mmol/L — AB (ref 3.5–5.1)
SODIUM: 141 mmol/L (ref 135–145)
Total Bilirubin: 0.7 mg/dL (ref 0.3–1.2)
Total Protein: 6.5 g/dL (ref 6.5–8.1)

## 2016-11-06 LAB — CBC WITH DIFFERENTIAL/PLATELET
Basophils Absolute: 0.1 10*3/uL (ref 0–0.1)
Basophils Relative: 2 %
EOS ABS: 0.2 10*3/uL (ref 0–0.7)
EOS PCT: 5 %
HCT: 38.3 % (ref 35.0–47.0)
Hemoglobin: 12.2 g/dL (ref 12.0–16.0)
Lymphocytes Relative: 35 %
Lymphs Abs: 1.4 10*3/uL (ref 1.0–3.6)
MCH: 25.8 pg — AB (ref 26.0–34.0)
MCHC: 31.9 g/dL — ABNORMAL LOW (ref 32.0–36.0)
MCV: 80.9 fL (ref 80.0–100.0)
MONO ABS: 0.3 10*3/uL (ref 0.2–0.9)
Monocytes Relative: 7 %
Neutro Abs: 2.1 10*3/uL (ref 1.4–6.5)
Neutrophils Relative %: 51 %
PLATELETS: 322 10*3/uL (ref 150–440)
RBC: 4.74 MIL/uL (ref 3.80–5.20)
RDW: 16.1 % — AB (ref 11.5–14.5)
WBC: 4 10*3/uL (ref 3.6–11.0)

## 2016-11-06 NOTE — Progress Notes (Signed)
Patient here today for follow up.  Patient c/o of swelling in her ankles recently

## 2016-11-06 NOTE — Progress Notes (Signed)
[place] NOTE  Patient Care Team: Birdie Sons, MD as PCP - General (Family Medicine) Robert Bellow, MD (General Surgery) Margarita Rana, MD as Referring Physician (Family Medicine)  CHIEF COMPLAINTS/PURPOSE OF CONSULTATION:  Breast cancer   #  Oncology History   # LEFT BREAST CANCER [pT1apN0] ER/PR/her 2 Neu- NEG. S/p Lumpec [Dr.Byrnett]; Mammosite [Dr.Crystal]; Mammaprint- 29% risk of recurrence; TC x4 [finished DEC 2017]     Carcinoma of upper-outer quadrant of left breast in female, estrogen receptor negative ([place])   04/18/2016 Initial Diagnosis    Malignant neoplasm of upper-outer quadrant of left female breast (HCC)       HISTORY OF PRESENTING ILLNESS:  Theresa Acosta 66 y.o.  female with  Newly diagnosed breast cancer left-sided stage I triple negative; high risk MammaPrint is s/p cycle #4 of Chemotherapy with Taxotere Cytoxan.  Feels good. He has some difficulty sleeping at nighttime. Denies any pain. Denies any significant nausea vomiting or chest pain or cough. No tingling and numbness. No swelling in the legs.  ROS: A complete 10 point review of system is done which is negative except mentioned above in history of present illness  MEDICAL HISTORY:  Past Medical History:  Diagnosis Date  . Breast cancer of upper-outer quadrant of left female breast (HCC)    T1a, N0, triple negative, Mammoprint: High risk  . Cancer ([place])   . Personal history of colonic polyps 2007    SURGICAL HISTORY: Past Surgical History:  Procedure Laterality Date  . 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  . BREAST LUMPECTOMY WITH NEEDLE LOCALIZATION Left 05/26/2016   Procedure: BREAST LUMPECTOMY WITH SENTINEL LYMPH NODE BX and needle localization;  Surgeon: Robert Bellow, MD;  Location: [facility] ORS;  Service: General;  Laterality: Left;  . BREAST MAMMOSITE Left  06/26/2016  . BUNIONECTOMY  2012  . COLONOSCOPY  2007 ( serated adenoma of the rectum), 2012 (normal)   Dr Bary Castilla  . COLONOSCOPY WITH PROPOFOL N/A 03/21/2016   Procedure: COLONOSCOPY WITH PROPOFOL;  Surgeon: Robert Bellow, MD;  Location: [facility] ENDOSCOPY;  Service: Endoscopy;  Laterality: N/A;  . SENTINEL NODE BIOPSY Left 05/26/2016   Procedure: SENTINEL NODE BIOPSY;  Surgeon: Robert Bellow, MD;  Location: [facility] ORS;  Service: General;  Laterality: Left;  . 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 Marital status: Divorced    Spouse name: N/A  . Number of children: 2  . Years of education: N/A   Occupational History  . Not on file.   Social History Main Topics  . Smoking status: Former Smoker    Packs/day: 2.00    Years: 30.00    Quit date: 02/27/2016  . Smokeless tobacco: Never Used  . Alcohol use No  . Drug use: No  . Sexual activity: Not on file   Other Topics Concern  . Not on file   Social History Narrative  . No narrative on file    FAMILY HISTORY: no hx of cancer in family. Youngest of 12 siblings.  Family History  Problem Relation Age of Onset  . Healthy Sister   . Healthy Brother   . Healthy Sister   . Healthy Brother   . Healthy Brother   . Cancer Neg Hx   . Breast cancer  Neg Hx     ALLERGIES:  has No Known Allergies.  MEDICATIONS:  Current Outpatient Prescriptions  Medication Sig Dispense Refill  . cyclobenzaprine (FLEXERIL) 5 MG tablet Take 1 tablet (5 mg total) by mouth 3 (three) times daily as needed for muscle spasms. 30 tablet 0  . loratadine (CLARITIN) 10 MG tablet Take 10 mg by mouth daily.    Marland Kitchen dexamethasone (DECADRON) 4 MG tablet Take 2 pills every 12 hours x 1 day; Start the day prior to chemo. Take it with food. (Patient not taking: Reported on 11/06/2016) 30 tablet 0   No current facility-administered medications for  this visit.       Marland Kitchen  PHYSICAL EXAMINATION: ECOG PERFORMANCE STATUS: 0 - Asymptomatic  Vitals:   11/06/16 1210  BP: 107/69  Pulse: 73  Temp: 98 F (36.7 C)   Filed Weights   11/06/16 1210  Weight: 181 lb 6 oz (82.3 kg)    GENERAL: Well-nourished well-developed; Alert, no distress and comfortable.   Alone.  EYES: no pallor or icterus OROPHARYNX: no thrush or ulceration; good dentition  NECK: supple, no masses felt LYMPH:  no palpable lymphadenopathy in the cervical, axillary or inguinal regions LUNGS: clear to auscultation and  No wheeze or crackles HEART/CVS: regular rate & rhythm and no murmurs; No lower extremity edema ABDOMEN: abdomen soft, non-tender and normal bowel sounds Musculoskeletal:no cyanosis of digits and no clubbing  PSYCH: alert & oriented x 3 with fluent speech NEURO: no focal motor/sensory deficits SKIN:  no rashes or significant lesions  LABORATORY DATA:  I have reviewed the data as listed Lab Results  Component Value Date   WBC 4.0 11/06/2016   HGB 12.2 11/06/2016   HCT 38.3 11/06/2016   MCV 80.9 11/06/2016   PLT 322 11/06/2016    Recent Labs  09/04/16 0844  09/25/16 0855 09/28/16 1342 10/05/16 0912 11/06/16 1100  NA 140  < > 141 137 139 141  K 3.7  < > 3.6 3.3* 3.1* 3.2*  CL 111  < > 109 103 103 109  CO2 22  < > 23 27 31 28   GLUCOSE 152*  < > 112* 122* 111* 119*  BUN 11  < > 13 12 10 12   CREATININE 0.53  < > 0.65 0.62 0.62 0.61  CALCIUM 9.3  < > 9.1 8.7* 8.3* 8.7*  GFRNONAA >60  < > >60 >60 >60 >60  GFRAA >60  < > >60 >60 >60 >60  PROT 6.8  --  6.8  --   --  6.5  ALBUMIN 3.5  --  3.5  --   --  3.4*  AST 19  --  20  --   --  30  ALT 13*  --  12*  --   --  26  ALKPHOS 73  --  64  --   --  64  BILITOT 0.2*  --  0.4  --   --  0.7  < > = values in this interval not displayed.  RADIOGRAPHIC STUDIES: I have personally reviewed the radiological images as listed and agreed with the findings in the report. No results  found.  ASSESSMENT & PLAN:   Carcinoma of upper-outer quadrant of left breast in female, estrogen receptor negative ([place]) Stage I triple negative breast cancer status post lumpectomy s/p MammoSite. High risk Mammoprint. S/p  adjuvant chemotherapy Taxotere and Cytoxan cycle #4. Finished December 2017. Clinically no evidence of recurrence.  # Difficulty sleeping at night recommend melatonin.  #  follow up after the mammogram in late may 2018/ no labs.  Follow-up with Dr. Bary Castilla in February 2018.  All questions were answered. The patient knows to call the clinic with any problems, questions or concerns.    Cammie Sickle, MD 11/07/2016 8:34 AM

## 2016-11-06 NOTE — Assessment & Plan Note (Addendum)
Stage I triple negative breast cancer status post lumpectomy s/p MammoSite. High risk Mammoprint. S/p  adjuvant chemotherapy Taxotere and Cytoxan cycle #4. Finished December 2017. Clinically no evidence of recurrence.  # Difficulty sleeping at night recommend melatonin.  # follow up after the mammogram in late may 2018/ no labs.  Follow-up with Dr. Bary Castilla in February 2018.

## 2016-11-08 ENCOUNTER — Other Ambulatory Visit: Payer: Self-pay | Admitting: *Deleted

## 2016-11-08 DIAGNOSIS — Z171 Estrogen receptor negative status [ER-]: Principal | ICD-10-CM

## 2016-11-08 DIAGNOSIS — C50412 Malignant neoplasm of upper-outer quadrant of left female breast: Secondary | ICD-10-CM

## 2016-11-14 ENCOUNTER — Ambulatory Visit: Payer: 59 | Admitting: General Surgery

## 2016-11-30 ENCOUNTER — Ambulatory Visit (INDEPENDENT_AMBULATORY_CARE_PROVIDER_SITE_OTHER): Payer: 59 | Admitting: General Surgery

## 2016-11-30 ENCOUNTER — Encounter: Payer: Self-pay | Admitting: General Surgery

## 2016-11-30 VITALS — BP 140/80 | HR 80 | Resp 12 | Ht 63.0 in | Wt 181.0 lb

## 2016-11-30 DIAGNOSIS — C50412 Malignant neoplasm of upper-outer quadrant of left female breast: Secondary | ICD-10-CM

## 2016-11-30 DIAGNOSIS — Z171 Estrogen receptor negative status [ER-]: Secondary | ICD-10-CM | POA: Diagnosis not present

## 2016-11-30 NOTE — Progress Notes (Signed)
Patient ID: Theresa Acosta, female   DOB: [birth year], 66 y.o.   MRN: [id number]  Chief Complaint  Patient presents with  . Breast Cancer    HPI Theresa Acosta is a 66 y.o. female here today for her follow up breast cancer follow up. She finished her treatment in December, 2017.  HPI  Past Medical History:  Diagnosis Date  . Breast cancer of upper-outer quadrant of left female breast ([place])    T1a, N0, triple negative, Mammoprint: High risk; Taxotere and Cytoxan adjuvant chemotherapy.   . Cancer ([place])   . 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 Personal history of colonic polyps 2007    Past Surgical History:  Procedure Laterality Date  . 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  . BREAST LUMPECTOMY WITH NEEDLE LOCALIZATION Left 05/26/2016   Procedure: BREAST LUMPECTOMY WITH SENTINEL LYMPH NODE BX and needle localization;  Surgeon: Robert Bellow, MD;  Location: [facility] ORS;  Service: General;  Laterality: Left;  . BREAST MAMMOSITE Left 06/26/2016  . BUNIONECTOMY  2012  . COLONOSCOPY  2007 ( serated adenoma of the rectum), 2012 (normal)   Dr Bary Castilla  . COLONOSCOPY WITH PROPOFOL N/A 03/21/2016   Procedure: COLONOSCOPY WITH PROPOFOL;  Surgeon: Robert Bellow, MD;  Location: [facility] ENDOSCOPY;  Service: Endoscopy;  Laterality: N/A;  . SENTINEL NODE BIOPSY Left 05/26/2016   Procedure: SENTINEL NODE BIOPSY;  Surgeon: Robert Bellow, MD;  Location: [facility] ORS;  Service: General;  Laterality: Left;  . TUBAL LIGATION  1977    Family History  Problem Relation Age of Onset  . Healthy Sister   . Healthy Brother   . Healthy Sister   . Healthy Brother   . Healthy Brother   . Cancer Neg Hx   . Breast cancer Neg Hx     Social History Social History  Substance Use Topics  . Smoking status: Former Smoker    Packs/day: 2.00    Years: 30.00    Quit date: 02/27/2016  . Smokeless  tobacco: Never Used  . Alcohol use No    No Known Allergies  Current Outpatient Prescriptions  Medication Sig Dispense Refill  . cyclobenzaprine (FLEXERIL) 5 MG tablet Take 1 tablet (5 mg total) by mouth 3 (three) times daily as needed for muscle spasms. 30 tablet 0  . loratadine (CLARITIN) 10 MG tablet Take 10 mg by mouth daily.     No current facility-administered medications for this visit.     Review of Systems Review of Systems  Constitutional: Negative.   Respiratory: Negative.   Cardiovascular: Negative.     Blood pressure 140/80, pulse 80, resp. rate 12, height 5\' 3"  (1.6 m), weight 181 lb (82.1 kg).  Physical Exam Physical Exam  Constitutional: She is oriented to person, place, and time. She appears well-developed and well-nourished.  Eyes: Conjunctivae are normal. No scleral icterus.  Neck: Neck supple.  Cardiovascular: Normal rate, regular rhythm and normal heart sounds.   Pulmonary/Chest: Effort normal and breath sounds normal. Right breast exhibits no inverted nipple, no mass, no nipple discharge, no skin change and no tenderness. Left breast exhibits no inverted nipple, no mass, no nipple discharge, no skin change and no tenderness.    Left breast lumpectomy site is clean and well healed.   Abdominal: Soft. Bowel sounds are normal. There is no tenderness.  Lymphadenopathy:    She has no cervical adenopathy.  She has no axillary adenopathy.  Neurological: She is alert and oriented to person, place, and time.  Skin: Skin is warm and dry.   No evidence of upper extremity swelling.   Assessment    Doing well 6 months out from wide excision and sentinel node biopsy.    Plan    Patient's mammograms have been scheduled by her medical oncologist for May.    We'll plan for a follow-up office exam in 4 months..  This information has been scribed by Gaspar Cola CMA.    Robert Bellow 12/01/2016, 9:57 AM

## 2016-11-30 NOTE — Patient Instructions (Addendum)
Return in four months. 

## 2016-12-01 ENCOUNTER — Encounter: Payer: Self-pay | Admitting: General Surgery

## 2017-02-01 ENCOUNTER — Encounter: Payer: Self-pay | Admitting: Radiation Oncology

## 2017-02-01 ENCOUNTER — Ambulatory Visit
Admission: RE | Admit: 2017-02-01 | Discharge: 2017-02-01 | Disposition: A | Discharge status: Home / Self Care | Payer: 59 | Source: Ambulatory Visit | Attending: Radiation Oncology | Admitting: Radiation Oncology

## 2017-02-01 VITALS — BP 152/76 | HR 73 | Temp 97.3°F | Resp 20 | Wt 185.3 lb

## 2017-02-01 DIAGNOSIS — C50412 Malignant neoplasm of upper-outer quadrant of left female breast: Secondary | ICD-10-CM

## 2017-02-01 DIAGNOSIS — Z853 Personal history of malignant neoplasm of breast: Secondary | ICD-10-CM | POA: Insufficient documentation

## 2017-02-01 DIAGNOSIS — Z923 Personal history of irradiation: Secondary | ICD-10-CM | POA: Diagnosis not present

## 2017-02-01 DIAGNOSIS — Z171 Estrogen receptor negative status [ER-]: Secondary | ICD-10-CM

## 2017-02-01 NOTE — Progress Notes (Signed)
Radiation Oncology Follow up Note  Name: Theresa Acosta   Date:   02/01/2017 MRN:  [id number] DOB: [birth year]    This 66 y.o. female presents to the clinic today for six-month follow-up status post accelerated partial breast irradiation to her left breast for stage I ER/PR negative invasive mammary carcinoma.  REFERRING PROVIDER: Birdie Sons, MD  HPI: patient is a 66 year old female now out 6 months having completed accelerated partial breast radiation to her left breast for stage I ER/PR negative invasive mammary carcinoma. Seen today in routine follow-up she is doing well. She specifically denies breast tenderness cough or bone pain. She is scheduled for mammograms in May. She is not on antiestrogen therapy based on the negative ER/PR nature of her disease..  COMPLICATIONS OF TREATMENT: none  FOLLOW UP COMPLIANCE: keeps appointments   PHYSICAL EXAM:  BP (!) 152/76   Pulse 73   Temp 97.3 F (36.3 C)   Resp 20   Wt 185 lb 4.8 oz (84.1 kg)   BMI 32.82 kg/m  Lungs are clear to A&P cardiac examination essentially unremarkable with regular rate and rhythm. No dominant mass or nodularity is noted in either breast in 2 positions examined. Incision is well-healed. No axillary or supraclavicular adenopathy is appreciated. Cosmetic result is excellent. Well-developed well-nourished patient in NAD. HEENT reveals PERLA, EOMI, discs not visualized.  Oral cavity is clear. No oral mucosal lesions are identified. Neck is clear without evidence of cervical or supraclavicular adenopathy. Lungs are clear to A&P. Cardiac examination is essentially unremarkable with regular rate and rhythm without murmur rub or thrill. Abdomen is benign with no organomegaly or masses noted. Motor sensory and DTR levels are equal and symmetric in the upper and lower extremities. Cranial nerves II through XII are grossly intact. Proprioception is intact. No peripheral adenopathy or edema is identified. No motor or sensory  levels are noted. Crude visual fields are within normal range.  RADIOLOGY RESULTS: no current films are reviewed will review her mammograms in May.  PLAN: present time she is doing extremely well with very little side effects or complaints. I'm please were overall progress. I've asked to see her back in 6 months and then will start once your follow-up visits. Patient is to call sooner with any concerns.  I would like to take this opportunity to thank you for allowing me to participate in the care of your patient.Armstead Peaks., MD

## 2017-03-16 ENCOUNTER — Ambulatory Visit
Admission: RE | Admit: 2017-03-16 | Discharge: 2017-03-16 | Disposition: A | Discharge status: Home / Self Care | Payer: 59 | Source: Ambulatory Visit | Attending: Internal Medicine | Admitting: Internal Medicine

## 2017-03-16 DIAGNOSIS — C50412 Malignant neoplasm of upper-outer quadrant of left female breast: Secondary | ICD-10-CM | POA: Insufficient documentation

## 2017-03-16 DIAGNOSIS — Z171 Estrogen receptor negative status [ER-]: Secondary | ICD-10-CM | POA: Insufficient documentation

## 2017-03-16 DIAGNOSIS — R928 Other abnormal and inconclusive findings on diagnostic imaging of breast: Secondary | ICD-10-CM | POA: Diagnosis not present

## 2017-03-16 HISTORY — DX: Malignant neoplasm of unspecified site of unspecified female breast: C50.919

## 2017-03-23 ENCOUNTER — Inpatient Hospital Stay: Payer: 59 | Attending: Internal Medicine

## 2017-03-23 ENCOUNTER — Inpatient Hospital Stay (HOSPITAL_BASED_OUTPATIENT_CLINIC_OR_DEPARTMENT_OTHER): Payer: 59 | Admitting: Internal Medicine

## 2017-03-23 VITALS — BP 107/69 | HR 70 | Temp 98.3°F | Wt 184.8 lb

## 2017-03-23 DIAGNOSIS — Z86010 Personal history of colonic polyps: Secondary | ICD-10-CM | POA: Diagnosis not present

## 2017-03-23 DIAGNOSIS — Z923 Personal history of irradiation: Secondary | ICD-10-CM

## 2017-03-23 DIAGNOSIS — Z171 Estrogen receptor negative status [ER-]: Secondary | ICD-10-CM | POA: Diagnosis not present

## 2017-03-23 DIAGNOSIS — C50412 Malignant neoplasm of upper-outer quadrant of left female breast: Secondary | ICD-10-CM | POA: Diagnosis not present

## 2017-03-23 DIAGNOSIS — Z87891 Personal history of nicotine dependence: Secondary | ICD-10-CM | POA: Diagnosis not present

## 2017-03-23 DIAGNOSIS — Z9221 Personal history of antineoplastic chemotherapy: Secondary | ICD-10-CM

## 2017-03-23 LAB — CBC WITH DIFFERENTIAL/PLATELET
BASOS ABS: 0 10*3/uL (ref 0–0.1)
Basophils Relative: 1 %
EOS ABS: 0.2 10*3/uL (ref 0–0.7)
Eosinophils Relative: 4 %
HCT: 36.7 % (ref 35.0–47.0)
HEMOGLOBIN: 12 g/dL (ref 12.0–16.0)
LYMPHS ABS: 1.7 10*3/uL (ref 1.0–3.6)
LYMPHS PCT: 34 %
MCH: 26.1 pg (ref 26.0–34.0)
MCHC: 32.8 g/dL (ref 32.0–36.0)
MCV: 79.5 fL — AB (ref 80.0–100.0)
Monocytes Absolute: 0.4 10*3/uL (ref 0.2–0.9)
Monocytes Relative: 7 %
NEUTROS PCT: 54 %
Neutro Abs: 2.6 10*3/uL (ref 1.4–6.5)
Platelets: 296 10*3/uL (ref 150–440)
RBC: 4.62 MIL/uL (ref 3.80–5.20)
RDW: 13.8 % (ref 11.5–14.5)
WBC: 4.8 10*3/uL (ref 3.6–11.0)

## 2017-03-23 LAB — COMPREHENSIVE METABOLIC PANEL
ALK PHOS: 79 U/L (ref 38–126)
ALT: 16 U/L (ref 14–54)
AST: 18 U/L (ref 15–41)
Albumin: 3.6 g/dL (ref 3.5–5.0)
Anion gap: 2 — ABNORMAL LOW (ref 5–15)
BUN: 15 mg/dL (ref 6–20)
CALCIUM: 9 mg/dL (ref 8.9–10.3)
CHLORIDE: 108 mmol/L (ref 101–111)
CO2: 28 mmol/L (ref 22–32)
CREATININE: 0.79 mg/dL (ref 0.44–1.00)
GFR calc non Af Amer: >60 mL/min (ref >60)
Glucose, Bld: 99 mg/dL (ref 65–99)
Potassium: 3.7 mmol/L (ref 3.5–5.1)
SODIUM: 138 mmol/L (ref 135–145)
Total Bilirubin: 0.6 mg/dL (ref 0.3–1.2)
Total Protein: 6.7 g/dL (ref 6.5–8.1)

## 2017-03-23 NOTE — Assessment & Plan Note (Addendum)
Stage I triple negative breast cancer status post lumpectomy s/p MammoSite. High risk Mammoprint. S/p  adjuvant chemotherapy Taxotere and Cytoxan cycle #4. Finished December 2017. Clinically no evidence of recurrence. Recent mammogram within normal limits.  # Difficulty sleeping at night-not improved on melatonin.? Need for sleep study. Defer to her PCP for ordering sleep study.  # Hemoglobin 12 / microcytosis and 79 MCV. recent colonoscopy May 2017. Repeat blood counts as next visit.  # I long discussion with patient regarding weight loss/ eating healthy and exercising - that it showed decreased recurrence of breast cancer. Patient interested in exercise program ; we will refer to [facility] care program.  # follow up cbc/BMP and iron studies in 6 months. 

## 2017-03-23 NOTE — Progress Notes (Signed)
[place] NOTE  Patient Care Team: Birdie Sons, MD as PCP - General (Family Medicine) Bary Castilla, Forest Gleason, MD (General Surgery) Margarita Rana, MD as Referring Physician (Family Medicine)  CHIEF COMPLAINTS/PURPOSE OF CONSULTATION:  Breast cancer   #  Oncology History   # LEFT BREAST CANCER [pT1apN0] ER/PR/her 2 Neu- NEG. S/p Lumpec [Dr.Byrnett]; Mammosite [Dr.Crystal]; Mammaprint- 29% risk of recurrence; TC x4 [finished DEC 2017]     Carcinoma of upper-outer quadrant of left breast in female, estrogen receptor negative ([place])   04/18/2016 Initial Diagnosis    Malignant neoplasm of upper-outer quadrant of left female breast ([place])       HISTORY OF PRESENTING ILLNESS:  Theresa Acosta 66 y.o.  female with  breast cancer left-sided stage I triple negative; high risk MammaPrint is s/p cycle #4 of Chemotherapy with Taxotere Cytoxan Is here for follow-up.  Patient denies any new lumps or bumps. Appetite is good. Denies any weight loss or headaches. She does admit to difficulty sleeping.  She admits to waking up multiple times at nighttime snoring. No tingling and numbness. No swelling in the legs.  ROS: A complete 10 point review of system is done which is negative except mentioned above in history of present illness  MEDICAL HISTORY:  Past Medical History:  Diagnosis Date  . Breast cancer ([place]) 04/2016   left breast  . 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   . Cancer ([place])   . 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 Personal history of colonic polyps 2007    SURGICAL HISTORY: Past Surgical History:  Procedure Laterality Date  . 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  . BREAST BIOPSY Right 05/09/2016   neg  . BREAST  LUMPECTOMY Left 05/26/2016   chemo and radiation  . BREAST LUMPECTOMY WITH NEEDLE LOCALIZATION Left 05/26/2016   Procedure: BREAST LUMPECTOMY WITH SENTINEL LYMPH NODE BX and needle localization;  Surgeon: Robert Bellow, MD;  Location: [facility] ORS;  Service: General;  Laterality: Left;  . BREAST MAMMOSITE Left 06/26/2016  . BUNIONECTOMY  2012  . COLONOSCOPY  2007 ( serated adenoma of the rectum), 2012 (normal)   Dr Bary Castilla  . COLONOSCOPY WITH PROPOFOL N/A 03/21/2016   Procedure: COLONOSCOPY WITH PROPOFOL;  Surgeon: Robert Bellow, MD;  Location: [facility] ENDOSCOPY;  Service: Endoscopy;  Laterality: N/A;  . SENTINEL NODE BIOPSY Left 05/26/2016   Procedure: SENTINEL NODE BIOPSY;  Surgeon: Robert Bellow, MD;  Location: [facility] ORS;  Service: General;  Laterality: Left;  . 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 Marital status: Divorced    Spouse name: N/A  . Number of children: 2  . Years of education: N/A   Occupational History  . Not on file.   Social History Main Topics  . Smoking status: Former Smoker    Packs/day: 2.00    Years: 30.00    Quit date: 02/27/2016  . Smokeless tobacco: Never Used  . Alcohol use No  . Drug use: No  . Sexual activity: Not on file   Other Topics Concern  . Not on file   Social History Narrative  . No narrative on file  FAMILY HISTORY: no hx of cancer in family. Youngest of 12 siblings.  Family History  Problem Relation Age of Onset  . Healthy Sister   . Healthy Brother   . Healthy Sister   . Healthy Brother   . Healthy Brother   . Cancer Neg Hx   . Breast cancer Neg Hx     ALLERGIES:  has No Known Allergies.  MEDICATIONS:  Current Outpatient Prescriptions  Medication Sig Dispense Refill  . cyclobenzaprine (FLEXERIL) 5 MG tablet Take 1 tablet (5 mg total) by mouth 3 (three) times daily as needed for muscle spasms.  30 tablet 0  . loratadine (CLARITIN) 10 MG tablet Take 10 mg by mouth daily.     No current facility-administered medications for this visit.       Marland Kitchen  PHYSICAL EXAMINATION: ECOG PERFORMANCE STATUS: 0 - Asymptomatic  Vitals:   03/23/17 1405  BP: 107/69  Pulse: 70  Temp: 98.3 F (36.8 C)   Filed Weights   03/23/17 1405  Weight: 184 lb 12.8 oz (83.8 kg)    GENERAL: Well-nourished well-developed; Alert, no distress and comfortable.   Alone.  EYES: no pallor or icterus OROPHARYNX: no thrush or ulceration; good dentition  NECK: supple, no masses felt LYMPH:  no palpable lymphadenopathy in the cervical, axillary or inguinal regions LUNGS: clear to auscultation and  No wheeze or crackles HEART/CVS: regular rate & rhythm and no murmurs; No lower extremity edema ABDOMEN: abdomen soft, non-tender and normal bowel sounds Musculoskeletal:no cyanosis of digits and no clubbing  PSYCH: alert & oriented x 3 with fluent speech NEURO: no focal motor/sensory deficits SKIN:  no rashes or significant lesions  LABORATORY DATA:  I have reviewed the data as listed Lab Results  Component Value Date   WBC 4.8 03/23/2017   HGB 12.0 03/23/2017   HCT 36.7 03/23/2017   MCV 79.5 (L) 03/23/2017   PLT 296 03/23/2017    Recent Labs  09/25/16 0855  10/05/16 0912 11/06/16 1100 03/23/17 1340  NA 141  < > 139 141 138  K 3.6  < > 3.1* 3.2* 3.7  CL 109  < > 103 109 108  CO2 23  < > 31 28 28   GLUCOSE 112*  < > 111* 119* 99  BUN 13  < > 10 12 15   CREATININE 0.65  < > 0.62 0.61 0.79  CALCIUM 9.1  < > 8.3* 8.7* 9.0  GFRNONAA >60  < > >60 >60 >60  GFRAA >60  < > >60 >60 >60  PROT 6.8  --   --  6.5 6.7  ALBUMIN 3.5  --   --  3.4* 3.6  AST 20  --   --  30 18  ALT 12*  --   --  26 16  ALKPHOS 64  --   --  64 79  BILITOT 0.4  --   --  0.7 0.6  < > = values in this interval not displayed.  RADIOGRAPHIC STUDIES: I have personally reviewed the radiological images as listed and agreed with the  findings in the report. Mm Diag Breast Tomo Bilateral  Result Date: 03/16/2017 CLINICAL DATA:  History of left breast cancer, diagnosed in 2017. Patient has completed lumpectomy, radiation, and chemotherapy. EXAM: 2D DIGITAL DIAGNOSTIC BILATERAL MAMMOGRAM WITH CAD AND ADJUNCT TOMO COMPARISON:  Previous exam(s). ACR Breast Density Category b: There are scattered areas of fibroglandular density. FINDINGS: There are lumpectomy changes in the outer left breast. Slight skin thickening of the left  breast is compatible with prior radiation therapy. No mass, nonsurgical distortion, or suspicious microcalcification is identified in either breast to suggest malignancy. Mammographic images were processed with CAD. IMPRESSION: No evidence of malignancy in either breast. Lumpectomy and radiation changes of the left breast. RECOMMENDATION: Diagnostic mammogram is suggested in 1 year. (Code:[id number]-B-01Y) I have discussed the findings and recommendations with the patient. Results were also provided in writing at the conclusion of the visit. If applicable, a reminder letter will be sent to the patient regarding the next appointment. BI-RADS CATEGORY  2: Benign. Electronically Signed   By: Curlene Dolphin M.D.   On: 03/16/2017 13:47    ASSESSMENT & PLAN:   Carcinoma of upper-outer quadrant of left breast in female, estrogen receptor negative ([facility]) Stage I triple negative breast cancer status post lumpectomy s/p MammoSite. High risk Mammoprint. S/p  adjuvant chemotherapy Taxotere and Cytoxan cycle #4. Finished December 2017. Clinically no evidence of recurrence. Recent mammogram within normal limits.  # Difficulty sleeping at night-not improved on melatonin.? Need for sleep study. Defer to her PCP for ordering sleep study.  # Hemoglobin 12 / microcytosis and 79 MCV. recent colonoscopy May 2017. Repeat blood counts as next visit.  # I long discussion with patient regarding weight loss/ eating healthy and exercising - that it  showed decreased recurrence of breast cancer. Patient interested in exercise program ; we will refer to [facility] care program.  # follow up cbc/BMP and iron studies in 6 months.  All questions were answered. The patient knows to call the clinic with any problems, questions or concerns.    Cammie Sickle, MD 03/24/2017 5:36 PM

## 2017-04-02 ENCOUNTER — Encounter: Payer: 59 | Admitting: Family Medicine

## 2017-04-02 ENCOUNTER — Ambulatory Visit (INDEPENDENT_AMBULATORY_CARE_PROVIDER_SITE_OTHER): Payer: 59 | Admitting: Family Medicine

## 2017-04-02 ENCOUNTER — Encounter: Payer: Self-pay | Admitting: Family Medicine

## 2017-04-02 VITALS — BP 144/70 | HR 90 | Temp 98.2°F | Resp 16 | Ht 63.0 in | Wt 186.0 lb

## 2017-04-02 DIAGNOSIS — N3000 Acute cystitis without hematuria: Secondary | ICD-10-CM

## 2017-04-02 DIAGNOSIS — Z87891 Personal history of nicotine dependence: Secondary | ICD-10-CM

## 2017-04-02 DIAGNOSIS — R739 Hyperglycemia, unspecified: Secondary | ICD-10-CM

## 2017-04-02 DIAGNOSIS — F5101 Primary insomnia: Secondary | ICD-10-CM | POA: Diagnosis not present

## 2017-04-02 DIAGNOSIS — Z86010 Personal history of colonic polyps: Secondary | ICD-10-CM

## 2017-04-02 DIAGNOSIS — E559 Vitamin D deficiency, unspecified: Secondary | ICD-10-CM

## 2017-04-02 DIAGNOSIS — R400 Somnolence: Secondary | ICD-10-CM | POA: Diagnosis not present

## 2017-04-02 DIAGNOSIS — Z0000 Encounter for general adult medical examination without abnormal findings: Secondary | ICD-10-CM | POA: Diagnosis not present

## 2017-04-02 DIAGNOSIS — C50412 Malignant neoplasm of upper-outer quadrant of left female breast: Secondary | ICD-10-CM

## 2017-04-02 DIAGNOSIS — R5383 Other fatigue: Secondary | ICD-10-CM | POA: Diagnosis not present

## 2017-04-02 DIAGNOSIS — Z171 Estrogen receptor negative status [ER-]: Secondary | ICD-10-CM

## 2017-04-02 MED ORDER — CIPROFLOXACIN HCL 500 MG PO TABS: 500.0000 mg | ORAL_TABLET | Freq: Two times a day (BID) | ORAL | 0 refills | Status: AC

## 2017-04-02 NOTE — Patient Instructions (Signed)
Preventive Care 40-64 Years, Female Preventive care refers to lifestyle choices and visits with your health care provider that can promote health and wellness. What does preventive care include?  A yearly physical exam. This is also called an annual well check.  Dental exams once or twice a year.  Routine eye exams. Ask your health care provider how often you should have your eyes checked.  Personal lifestyle choices, including: ? Daily care of your teeth and gums. ? Regular physical activity. ? Eating a healthy diet. ? Avoiding tobacco and drug use. ? Limiting alcohol use. ? Practicing safe sex. ? Taking low-dose aspirin daily starting at age 58. ? Taking vitamin and mineral supplements as recommended by your health care provider. What happens during an annual well check? The services and screenings done by your health care provider during your annual well check will depend on your age, overall health, lifestyle risk factors, and family history of disease. Counseling Your health care provider may ask you questions about your:  Alcohol use.  Tobacco use.  Drug use.  Emotional well-being.  Home and relationship well-being.  Sexual activity.  Eating habits.  Work and work Statistician.  Method of birth control.  Menstrual cycle.  Pregnancy history.  Screening You may have the following tests or measurements:  Height, weight, and BMI.  Blood pressure.  Lipid and cholesterol levels. These may be checked every 5 years, or more frequently if you are over 81 years old.  Skin check.  Lung cancer screening. You may have this screening every year starting at age 78 if you have a 30-pack-year history of smoking and currently smoke or have quit within the past 15 years.  Fecal occult blood test (FOBT) of the stool. You may have this test every year starting at age 65.  Flexible sigmoidoscopy or colonoscopy. You may have a sigmoidoscopy every 5 years or a colonoscopy  every 10 years starting at age 30.  Hepatitis C blood test.  Hepatitis B blood test.  Sexually transmitted disease (STD) testing.  Diabetes screening. This is done by checking your blood sugar (glucose) after you have not eaten for a while (fasting). You may have this done every 1-3 years.  Mammogram. This may be done every 1-2 years. Talk to your health care provider about when you should start having regular mammograms. This may depend on whether you have a family history of breast cancer.  BRCA-related cancer screening. This may be done if you have a family history of breast, ovarian, tubal, or peritoneal cancers.  Pelvic exam and Pap test. This may be done every 3 years starting at age 80. Starting at age 36, this may be done every 5 years if you have a Pap test in combination with an HPV test.  Bone density scan. This is done to screen for osteoporosis. You may have this scan if you are at high risk for osteoporosis.  Discuss your test results, treatment options, and if necessary, the need for more tests with your health care provider. Vaccines Your health care provider may recommend certain vaccines, such as:  Influenza vaccine. This is recommended every year.  Tetanus, diphtheria, and acellular pertussis (Tdap, Td) vaccine. You may need a Td booster every 10 years.  Varicella vaccine. You may need this if you have not been vaccinated.  Zoster vaccine. You may need this after age 5.  Measles, mumps, and rubella (MMR) vaccine. You may need at least one dose of MMR if you were born in  1957 or later. You may also need a second dose.  Pneumococcal 13-valent conjugate (PCV13) vaccine. You may need this if you have certain conditions and were not previously vaccinated.  Pneumococcal polysaccharide (PPSV23) vaccine. You may need one or two doses if you smoke cigarettes or if you have certain conditions.  Meningococcal vaccine. You may need this if you have certain  conditions.  Hepatitis A vaccine. You may need this if you have certain conditions or if you travel or work in places where you may be exposed to hepatitis A.  Hepatitis B vaccine. You may need this if you have certain conditions or if you travel or work in places where you may be exposed to hepatitis B.  Haemophilus influenzae type b (Hib) vaccine. You may need this if you have certain conditions.  Talk to your health care provider about which screenings and vaccines you need and how often you need them. This information is not intended to replace advice given to you by your health care provider. Make sure you discuss any questions you have with your health care provider. Document Released: 11/12/2015 Document Revised: 07/05/2016 Document Reviewed: 08/17/2015 Elsevier Interactive Patient Education  2017 [employer].

## 2017-04-02 NOTE — Progress Notes (Signed)
Patient: Theresa Acosta, Female    DOB: 1951/10/11, 66 y.o.   MRN: [id number] Visit Date: 04/02/2017  Today's Provider: Lelon Huh, MD   Chief Complaint  Patient presents with  . Annual Exam  . Hyperlipidemia   Subjective:  This is a previous patient of Dr. Venia Minks present today as new patient to me to establish care and follow up on chronic medical problems.    Annual physical exam Theresa Acosta is a 66 y.o. female who presents today for health maintenance and complete physical. She feels well. She reports exercising none. She reports she is sleeping poorly.  ----------------------------------------------------------------    Lipid/Cholesterol, Follow-up:   Last seen for this 1 years ago.  Management since that visit includes; labs ordered, zero done.  Last Lipid Panel: No results found for: CHOL, TRIG, HDL, CHOLHDL, VLDL, LDLCALC, LDLDIRECT  She reports good compliance with treatment. She is not having side effects. none  Wt Readings from Last 3 Encounters:  04/02/17 186 lb (84.4 kg)  03/23/17 184 lb 12.8 oz (83.8 kg)  02/01/17 185 lb 4.8 oz (84.1 kg)    ----------------------------------------------------------------  Avitaminosis D Is still taking OTC vitamin d and tolerating well. Is due to have rechecked.   5. Blood glucose elevated   Complains of feeling fatigue and having trouble sleeping for the last year since diagnosed with breast cancer last year. Has tried melatonin, Claritin, and Advil PM. Falls asleep easily, but wakes frequently. States she snores very loudly.   Review of Systems  Constitutional: Positive for fatigue. Negative for chills and fever.  HENT: Negative for congestion, ear pain, rhinorrhea, sneezing and sore throat.   Eyes: Negative.  Negative for pain and redness.  Respiratory: Negative for cough, shortness of breath and wheezing.   Cardiovascular: Negative for chest pain and leg swelling.  Gastrointestinal: Negative  for abdominal pain, blood in stool, constipation, diarrhea and nausea.  Endocrine: Negative for polydipsia and polyphagia.  Genitourinary: Positive for dysuria. Negative for flank pain, hematuria, pelvic pain, vaginal bleeding and vaginal discharge.  Musculoskeletal: Negative for arthralgias, back pain, gait problem and joint swelling.  Skin: Negative for rash.  Neurological: Negative.  Negative for dizziness, tremors, seizures, weakness, light-headedness, numbness and headaches.  Hematological: Negative for adenopathy.  Psychiatric/Behavioral: Negative.  Negative for behavioral problems, confusion and dysphoric mood. The patient is not nervous/anxious and is not hyperactive.     Social History      She  reports that she quit smoking about 13 months ago. She has a 15.00 pack-year smoking history. She has never used smokeless tobacco. She reports that she does not drink alcohol or use drugs.       Social History   Social History  . Marital status: Divorced    Spouse name: N/A  . Number of children: 2  . Years of education: N/A   Social History Main Topics  . Smoking status: Former Smoker    Packs/day: 0.50    Years: 30.00    Quit date: 02/27/2016  . Smokeless tobacco: Never Used     Comment: 1/2 ppd since age 42 quit age 76  . Alcohol use No  . Drug use: No  . Sexual activity: Not Asked   Other Topics Concern  . None   Social History Narrative  . None    Past Medical History:  Diagnosis Date  . Breast cancer ([place]) 04/2016   left breast  . 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   . Cancer ([place])   . 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 Personal history of colonic polyps 2007     Patient Active Problem List   Diagnosis Date Noted  . Breast mass, right 05/09/2016  . Personal history of tobacco use, presenting hazards to health  04/20/2016  . Carcinoma of upper-outer quadrant of left breast in female, estrogen receptor negative ([place]) 04/18/2016  . Blood in the urine 03/29/2016  . Hypercholesteremia 03/29/2016  . Blood glucose elevated 03/29/2016  . Obesity 03/29/2016  . Vitamin D deficiency 03/29/2016  . History of colonic polyps 03/03/2016  . Smoking greater than 30 pack years 04/22/2008    Past Surgical History:  Procedure Laterality Date  . 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  . BREAST BIOPSY Right 05/09/2016   neg  . BREAST LUMPECTOMY Left 05/26/2016   chemo and radiation  . BREAST LUMPECTOMY WITH NEEDLE LOCALIZATION Left 05/26/2016   Procedure: BREAST LUMPECTOMY WITH SENTINEL LYMPH NODE BX and needle localization;  Surgeon: Robert Bellow, MD;  Location: [facility] ORS;  Service: General;  Laterality: Left;  . BREAST MAMMOSITE Left 06/26/2016  . BUNIONECTOMY  2012  . COLONOSCOPY WITH PROPOFOL N/A 03/21/2016   Procedure: COLONOSCOPY WITH PROPOFOL;  Surgeon: Robert Bellow, MD;  Location: [facility] ENDOSCOPY;  Service: Endoscopy;  Laterality: N/A;  . SENTINEL NODE BIOPSY Left 05/26/2016   Procedure: SENTINEL NODE BIOPSY;  Surgeon: Robert Bellow, MD;  Location: [facility] ORS;  Service: General;  Laterality: Left;  . TUBAL LIGATION  1977    Family History        Family Status  Relation Status  . Father Deceased at age 48  . Mother Deceased at age 65       pneumonia  . Sister Alive  . Brother Alive  . Sister Alive  . Brother Alive  . Brother Alive  . Neg Hx (Not Specified)        Her family history includes Healthy in her brother, brother, brother, sister, and sister.     No Known Allergies   Current Outpatient Prescriptions:  .  cyclobenzaprine (FLEXERIL) 5 MG tablet, Take 1 tablet (5 mg total) by mouth 3 (three) times daily as needed for muscle spasms., Disp: 30 tablet, Rfl: 0 .  loratadine (CLARITIN) 10  MG tablet, Take 10 mg by mouth daily., Disp: , Rfl:    Patient Care Team: Birdie Sons, MD as PCP - General (Family Medicine) Bary Castilla, Forest Gleason, MD (General Surgery) Margarita Rana, MD as Referring Physician (Family Medicine)      Objective:   Vitals: BP (!) 144/70 (BP Location: Right Arm, Patient Position: Sitting, Cuff Size: Normal)   Pulse 90   Temp 98.2 F (36.8 C) (Oral)   Resp 16   Ht 5\' 3"  (1.6 m)   Wt 186 lb (84.4 kg)   SpO2 95%   BMI 32.95 kg/m    Vitals:   04/02/17 1430  BP: (!) 144/70  Pulse: 90  Resp: 16  Temp: 98.2 F (36.8 C)  TempSrc: Oral  SpO2: 95%  Weight: 186 lb (84.4 kg)  Height: 5\' 3"  (1.6 m)     Physical Exam  General Appearance:    Alert, cooperative, no distress, obese  Eyes:    PERRL, conjunctiva/corneas clear, EOM's intact  Lungs:     Clear to auscultation bilaterally, respirations unlabored  Heart:    Regular rate and rhythm  Neurologic:   Awake, alert, oriented x 3. No apparent focal neurological           defect.      Epworth=17 Depression Screen PHQ 2/9 Scores 04/02/2017 02/01/2017 08/02/2016 03/29/2016  PHQ - 2 Score 0 0 0 0  PHQ- 9 Score 6 - - -      Assessment & Plan:     Routine Health Maintenance and Physical Exam  Exercise Activities and Dietary recommendations Goals    . Exercise 150 minutes per week (moderate activity)       Immunization History  Administered Date(s) Administered  . Td 01/04/2015  . Tdap 01/04/2015    Health Maintenance  Topic Date Due  . HIV Screening  09/28/1967  . INFLUENZA VACCINE  05/30/2017  . MAMMOGRAM  03/17/2019  . PAP SMEAR  03/30/2019  . TETANUS/TDAP  01/03/2025  . COLONOSCOPY  03/21/2026  . Hepatitis C Screening  Completed     Discussed health benefits of physical activity, and encouraged her to engage in regular exercise appropriate for her age and condition.    --------------------------------------------------------------------  1. Annual physical exam  -  Lipid panel - Hemoglobin A1c  2. History of colonic polyps Colonoscopy done 2017   3. Vitamin D deficiency  - VITAMIN D 25 Hydroxy (Vit-D Deficiency, Fractures)  4. Carcinoma of upper-outer quadrant of left breast in female, estrogen receptor negative ([place]) Continue regular follow up oncology and surgery.   5. Former light cigarette smoker (1-9 per day)   6. Blood glucose elevated  - Hemoglobin A1c  7. Primary insomnia   8. Fatigue, unspecified type  - TSH  9. Daytime somnolence Likely OSA. Order MSPG  10. Acute cystitis without hematuria Cipro 500 BID 7 days. Urine culture   Lelon Huh, MD  [facility]

## 2017-04-03 ENCOUNTER — Other Ambulatory Visit
Admission: RE | Admit: 2017-04-03 | Discharge: 2017-04-03 | Disposition: A | Discharge status: Home / Self Care | Payer: 59 | Source: Ambulatory Visit | Attending: Family Medicine | Admitting: Family Medicine

## 2017-04-03 ENCOUNTER — Ambulatory Visit (INDEPENDENT_AMBULATORY_CARE_PROVIDER_SITE_OTHER): Payer: 59 | Admitting: General Surgery

## 2017-04-03 ENCOUNTER — Encounter: Payer: Self-pay | Admitting: General Surgery

## 2017-04-03 VITALS — BP 132/80 | HR 74 | Resp 12 | Ht 62.0 in | Wt 185.0 lb

## 2017-04-03 DIAGNOSIS — E559 Vitamin D deficiency, unspecified: Secondary | ICD-10-CM | POA: Insufficient documentation

## 2017-04-03 DIAGNOSIS — C50412 Malignant neoplasm of upper-outer quadrant of left female breast: Secondary | ICD-10-CM

## 2017-04-03 DIAGNOSIS — R739 Hyperglycemia, unspecified: Secondary | ICD-10-CM | POA: Insufficient documentation

## 2017-04-03 DIAGNOSIS — Z171 Estrogen receptor negative status [ER-]: Secondary | ICD-10-CM

## 2017-04-03 DIAGNOSIS — R5383 Other fatigue: Secondary | ICD-10-CM | POA: Diagnosis not present

## 2017-04-03 LAB — LIPID PANEL
CHOL/HDL RATIO: 4.3 ratio
CHOLESTEROL: 200 mg/dL (ref 0–200)
HDL: 46 mg/dL (ref >40)
LDL Cholesterol: 143 mg/dL — ABNORMAL HIGH (ref 0–99)
TRIGLYCERIDES: 57 mg/dL (ref <150)
VLDL: 11 mg/dL (ref 0–40)

## 2017-04-03 LAB — TSH: TSH: 1.322 u[IU]/mL (ref 0.350–4.500)

## 2017-04-03 NOTE — Patient Instructions (Signed)
Patient to return in one year breast cancer check up.

## 2017-04-03 NOTE — Progress Notes (Signed)
Patient ID: Theresa Acosta, female   DOB: [birth year], 66 y.o.   MRN: [id number]  Chief Complaint  Patient presents with  . Breast Cancer    HPI Theresa Acosta is a 66 y.o. female here today for her breast cancer follow up. Mammogram was done on 03/16/2017, Ordered by her medical oncologist. Patient states she is doing well.   Patient is getting a sleep test done.  HPI  Past Medical History:  Diagnosis Date  . Breast cancer ([place]) 04/2016   left breast  . 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   . Cancer ([place])   . 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 Personal history of colonic polyps 2007    Past Surgical History:  Procedure Laterality Date  . 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  . BREAST BIOPSY Right 05/09/2016   neg  . BREAST LUMPECTOMY Left 05/26/2016   chemo and radiation  . BREAST LUMPECTOMY WITH NEEDLE LOCALIZATION Left 05/26/2016   Procedure: BREAST LUMPECTOMY WITH SENTINEL LYMPH NODE BX and needle localization;  Surgeon: Robert Bellow, MD;  Location: [facility] ORS;  Service: General;  Laterality: Left;  . BREAST MAMMOSITE Left 06/26/2016  . BUNIONECTOMY  2012  . COLONOSCOPY WITH PROPOFOL N/A 03/21/2016   Procedure: COLONOSCOPY WITH PROPOFOL;  Surgeon: Robert Bellow, MD;  Location: [facility];  Service: Endoscopy;  Laterality: N/A;  . SENTINEL NODE BIOPSY Left 05/26/2016   Procedure: SENTINEL NODE BIOPSY;  Surgeon: Robert Bellow, MD;  Location: [facility] ORS;  Service: General;  Laterality: Left;  . TUBAL LIGATION  1977    Family History  Problem Relation Age of Onset  . Healthy Sister   . Healthy Brother   . Healthy Sister   . Healthy Brother   . Healthy Brother   . Cancer Neg Hx   . Breast cancer Neg Hx     Social  History Social History  Substance Use Topics  . Smoking status: Former Smoker    Packs/day: 0.50    Years: 30.00    Quit date: 02/27/2016  . Smokeless tobacco: Never Used     Comment: 1/2 ppd since age 10 quit age 65  . Alcohol use No    No Known Allergies  Current Outpatient Prescriptions  Medication Sig Dispense Refill  . ciprofloxacin (CIPRO) 500 MG tablet Take 1 tablet (500 mg total) by mouth 2 (two) times daily. 14 tablet 0  . cyclobenzaprine (FLEXERIL) 5 MG tablet Take 1 tablet (5 mg total) by mouth 3 (three) times daily as needed for muscle spasms. 30 tablet 0  . loratadine (CLARITIN) 10 MG tablet Take 10 mg by mouth daily.     No current facility-administered medications for this visit.     Review of Systems Review of Systems  Constitutional: Negative.   Respiratory: Negative.   Cardiovascular: Negative.     Blood pressure 132/80, pulse 74, resp. rate 12, height 5\' 2"  (1.575 m), weight 185 lb (83.9 kg).  Physical Exam Physical Exam  Constitutional: She is oriented to person, place, and time. She appears well-developed and well-nourished.  Eyes: Conjunctivae are normal. No scleral icterus.  Neck: Neck supple.  Cardiovascular: Normal rate, regular rhythm and normal heart sounds.   Pulmonary/Chest: Effort normal and breath sounds normal. Right breast exhibits no inverted nipple, no  mass, no nipple discharge, no skin change and no tenderness. Left breast exhibits no inverted nipple, no mass, no nipple discharge, no skin change and no tenderness.    Lymphadenopathy:    She has no cervical adenopathy.    She has no axillary adenopathy.  Neurological: She is alert and oriented to person, place, and time.  Skin: Skin is warm and dry.    Data Reviewed 03/16/2017 mammogram was reviewed and compared to her previous procedure study of 05/09/2016. BI-RADS-2.  Focal area of abnormality in the right breast noted in the 2017 studies is no longer appreciated.  Assessment     Benign breast exam one year post treatment for triple negative cancer.    Plan        Patient to return in one year breast cancer check up. Dr. Rogue Bussing will be responsible for ordering her mammograms based on his past effort.  HPI, Physical Exam, Assessment and Plan have been scribed under the direction and in the presence of Hervey Ard, MD.  Gaspar Cola, CMA  I have completed the exam and reviewed the above documentation for accuracy and completeness.  I agree with the above.  Haematologist has been used and any errors in dictation or transcription are unintentional.  Hervey Ard, M.D., F.A.C.S.  Robert Bellow 04/03/2017, 6:55 PM

## 2017-04-04 ENCOUNTER — Telehealth: Payer: Self-pay | Admitting: Family Medicine

## 2017-04-04 LAB — VITAMIN D 25 HYDROXY (VIT D DEFICIENCY, FRACTURES): Vit D, 25-Hydroxy: 10.9 ng/mL — ABNORMAL LOW (ref 30.0–100.0)

## 2017-04-04 LAB — HEMOGLOBIN A1C
Hgb A1c MFr Bld: 5.9 % — ABNORMAL HIGH (ref 4.8–5.6)
Mean Plasma Glucose: 123 mg/dL

## 2017-04-04 LAB — URINE CULTURE

## 2017-04-04 LAB — CULTURE, URINE COMPREHENSIVE

## 2017-04-04 NOTE — Telephone Encounter (Signed)
Pt is returning call.  CB#[phone]/MW

## 2017-04-04 NOTE — Telephone Encounter (Signed)
Patient was notified of results. Expressed understanding.  

## 2017-04-17 ENCOUNTER — Telehealth: Payer: Self-pay | Admitting: *Deleted

## 2017-04-17 DIAGNOSIS — Z87891 Personal history of nicotine dependence: Secondary | ICD-10-CM

## 2017-04-17 NOTE — Telephone Encounter (Signed)
Notified patient that annual lung cancer screening low dose CT scan is due currently or will be in near future. Confirmed that patient is within the age range of 55-77, and asymptomatic, (no signs or symptoms of lung cancer). Patient denies illness that would prevent curative treatment for lung cancer if found. Verified smoking history, (former, quit 02/27/16, 60 pack year). The shared decision making visit was done 04/21/16. Patient is agreeable for CT scan being scheduled.  

## 2017-04-19 ENCOUNTER — Ambulatory Visit: Payer: 59 | Attending: Otolaryngology

## 2017-04-19 DIAGNOSIS — R0683 Snoring: Secondary | ICD-10-CM | POA: Diagnosis not present

## 2017-04-19 DIAGNOSIS — G4733 Obstructive sleep apnea (adult) (pediatric): Secondary | ICD-10-CM | POA: Diagnosis not present

## 2017-04-24 ENCOUNTER — Ambulatory Visit: Payer: 59

## 2017-04-26 ENCOUNTER — Ambulatory Visit: Admission: RE | Admit: 2017-04-26 | Payer: 59 | Source: Ambulatory Visit

## 2017-04-30 ENCOUNTER — Encounter: Payer: Self-pay | Admitting: Family Medicine

## 2017-04-30 DIAGNOSIS — G4733 Obstructive sleep apnea (adult) (pediatric): Secondary | ICD-10-CM | POA: Insufficient documentation

## 2017-04-30 DIAGNOSIS — Z9989 Dependence on other enabling machines and devices: Secondary | ICD-10-CM

## 2017-05-03 ENCOUNTER — Encounter: Payer: Self-pay | Admitting: Family Medicine

## 2017-05-10 ENCOUNTER — Telehealth: Payer: Self-pay | Admitting: *Deleted

## 2017-05-10 DIAGNOSIS — Z87891 Personal history of nicotine dependence: Secondary | ICD-10-CM

## 2017-05-10 NOTE — Telephone Encounter (Signed)
Contacted patient regarding no show appt for lung screening. Patient requests appt to be rescheduled. Please see prior documentation regarding eligibility.

## 2017-05-11 DIAGNOSIS — G4733 Obstructive sleep apnea (adult) (pediatric): Secondary | ICD-10-CM | POA: Diagnosis not present

## 2017-05-24 ENCOUNTER — Ambulatory Visit
Admission: RE | Admit: 2017-05-24 | Discharge: 2017-05-24 | Disposition: A | Discharge status: Home / Self Care | Payer: 59 | Source: Ambulatory Visit | Attending: Oncology | Admitting: Oncology

## 2017-05-24 DIAGNOSIS — Z122 Encounter for screening for malignant neoplasm of respiratory organs: Secondary | ICD-10-CM | POA: Diagnosis not present

## 2017-05-24 DIAGNOSIS — Z87891 Personal history of nicotine dependence: Secondary | ICD-10-CM | POA: Insufficient documentation

## 2017-05-24 DIAGNOSIS — Z9889 Other specified postprocedural states: Secondary | ICD-10-CM | POA: Insufficient documentation

## 2017-05-29 ENCOUNTER — Encounter: Payer: Self-pay | Admitting: *Deleted

## 2017-06-11 DIAGNOSIS — G4733 Obstructive sleep apnea (adult) (pediatric): Secondary | ICD-10-CM | POA: Diagnosis not present

## 2017-07-12 DIAGNOSIS — G4733 Obstructive sleep apnea (adult) (pediatric): Secondary | ICD-10-CM | POA: Diagnosis not present

## 2017-07-16 DIAGNOSIS — H524 Presbyopia: Secondary | ICD-10-CM | POA: Diagnosis not present

## 2017-08-02 ENCOUNTER — Encounter: Payer: Self-pay | Admitting: Radiation Oncology

## 2017-08-02 ENCOUNTER — Ambulatory Visit
Admission: RE | Admit: 2017-08-02 | Discharge: 2017-08-02 | Disposition: A | Discharge status: Home / Self Care | Payer: 59 | Source: Ambulatory Visit | Attending: Radiation Oncology | Admitting: Radiation Oncology

## 2017-08-02 VITALS — BP 129/74 | HR 69 | Temp 98.0°F | Wt 188.4 lb

## 2017-08-02 DIAGNOSIS — Z923 Personal history of irradiation: Secondary | ICD-10-CM | POA: Insufficient documentation

## 2017-08-02 DIAGNOSIS — C50412 Malignant neoplasm of upper-outer quadrant of left female breast: Secondary | ICD-10-CM

## 2017-08-02 DIAGNOSIS — Z853 Personal history of malignant neoplasm of breast: Secondary | ICD-10-CM | POA: Insufficient documentation

## 2017-08-02 DIAGNOSIS — Z171 Estrogen receptor negative status [ER-]: Secondary | ICD-10-CM

## 2017-08-02 NOTE — Progress Notes (Signed)
Radiation Oncology Follow up Note  Name: Theresa Acosta   Date:   08/02/2017 MRN:  [id number] DOB: [birth year]    This 66 y.o. female presents to the clinic today for 1 year follow-up status post accelerated partial breast radiation to her left breast for stage I ER/PR negative invasive mammary carcinoma  REFERRING PROVIDER: Birdie Sons, MD  HPI: patient is a 66 year old female now out 1 year having completed accelerated partial breast radiation to her left breast for stage I ER/PR negative invasive mammary carcinoma. Seen today in routine follow-up she is doing well. She specifically denies breast tenderness cough or bone pain..her last mammogram which I have reviewed was back in May 2018 BI-RADS 2 benign.she is also had back in July CT screening for lung cancer which was negative. She is not on antiestrogen therapy based on the ER/PR negative nature of her disease.  COMPLICATIONS OF TREATMENT: none  FOLLOW UP COMPLIANCE: keeps appointments   PHYSICAL EXAM:  BP 129/74   Pulse 69   Temp 98 F (36.7 C)   Wt 188 lb 6.1 oz (85.5 kg)   BMI 32.34 kg/m  Lungs are clear to A&P cardiac examination essentially unremarkable with regular rate and rhythm. No dominant mass or nodularity is noted in either breast in 2 positions examined. Incision is well-healed. No axillary or supraclavicular adenopathy is appreciated. Cosmetic result is excellent. Well-developed well-nourished patient in NAD. HEENT reveals PERLA, EOMI, discs not visualized.  Oral cavity is clear. No oral mucosal lesions are identified. Neck is clear without evidence of cervical or supraclavicular adenopathy. Lungs are clear to A&P. Cardiac examination is essentially unremarkable with regular rate and rhythm without murmur rub or thrill. Abdomen is benign with no organomegaly or masses noted. Motor sensory and DTR levels are equal and symmetric in the upper and lower extremities. Cranial nerves II through XII are grossly intact.  Proprioception is intact. No peripheral adenopathy or edema is identified. No motor or sensory levels are noted. Crude visual fields are within normal range.  RADIOLOGY RESULTS: mammograms from May are reviewed and compatible with the above-stated findings  PLAN: present time patient continues to do well with no evidence of disease. I'm please were overall progress. I've asked to see her back in 1 year for follow-up. Patient is to call with any concerns.  I would like to take this opportunity to thank you for allowing me to participate in the care of your patient.Armstead Peaks., MD

## 2017-08-11 DIAGNOSIS — G4733 Obstructive sleep apnea (adult) (pediatric): Secondary | ICD-10-CM | POA: Diagnosis not present

## 2017-08-16 DIAGNOSIS — G4733 Obstructive sleep apnea (adult) (pediatric): Secondary | ICD-10-CM | POA: Diagnosis not present

## 2017-08-28 ENCOUNTER — Telehealth: Payer: Self-pay | Admitting: Family Medicine

## 2017-08-28 DIAGNOSIS — E559 Vitamin D deficiency, unspecified: Secondary | ICD-10-CM

## 2017-08-28 NOTE — Telephone Encounter (Signed)
Patient reports that she takes an OTC Vit D supplement when she remembers. Lab slip printed and advised patient to have levels rechecked.

## 2017-08-28 NOTE — Telephone Encounter (Signed)
-----   Message from Birdie Sons, MD sent at 08/23/2017  1:36 PM EDT ----- Regarding: FW: check vitamin d levels in october.    ----- Message ----- From: Birdie Sons, MD Sent: 08/04/2017 To: Birdie Sons, MD Subject: check vitamin d levels in october.

## 2017-08-28 NOTE — Telephone Encounter (Signed)
Please check with patient to make sure she has been taking vitamin d supplements. It is time to recheck vitamin D 25-OH to make sure she is getting enough.

## 2017-09-24 ENCOUNTER — Inpatient Hospital Stay: Payer: 59

## 2017-09-24 ENCOUNTER — Inpatient Hospital Stay: Payer: 59 | Admitting: Hematology and Oncology

## 2017-09-28 ENCOUNTER — Other Ambulatory Visit: Payer: 59

## 2017-09-28 ENCOUNTER — Ambulatory Visit: Payer: 59 | Admitting: Internal Medicine

## 2017-10-08 ENCOUNTER — Inpatient Hospital Stay: Payer: 59

## 2017-10-08 ENCOUNTER — Inpatient Hospital Stay: Payer: 59 | Admitting: Internal Medicine

## 2017-10-11 ENCOUNTER — Inpatient Hospital Stay: Payer: 59

## 2017-10-11 ENCOUNTER — Inpatient Hospital Stay: Payer: 59 | Admitting: Internal Medicine

## 2017-10-15 ENCOUNTER — Telehealth: Payer: Self-pay

## 2017-10-15 NOTE — Telephone Encounter (Signed)
I left a message for patient to return phone call regarding re-scheduling her appt with Dr. Rogue Bussing.

## 2017-10-15 NOTE — Telephone Encounter (Signed)
-----   Message from Theresa Acosta sent at 10/11/2017  4:11 PM EST ----- Left messages and resch her serveral times do we need to send a letter gotten no answer and havent talked to her personally. Just let me know what to do. Thanks

## 2017-10-16 NOTE — Telephone Encounter (Signed)
Theresa Acosta and [name], pt has h/o breast cancer and has not follow-up in our office. We have left vm-with no return phone call.  Could you also help [place] f/u on this patient. Thanks.

## 2017-10-19 NOTE — Telephone Encounter (Signed)
Theresa Acosta, I was able to get in contact with patient. Would you mind scheduling patient first week of Jan to follow up with Dr. Jacinto Reap? Thanks!

## 2017-10-31 ENCOUNTER — Encounter: Payer: Self-pay | Admitting: *Deleted

## 2017-10-31 ENCOUNTER — Inpatient Hospital Stay: Payer: 59 | Attending: Internal Medicine | Admitting: Internal Medicine

## 2017-10-31 NOTE — Assessment & Plan Note (Deleted)
Stage I triple negative breast cancer status post lumpectomy s/p MammoSite. High risk Mammoprint. S/p  adjuvant chemotherapy Taxotere and Cytoxan cycle #4. Finished December 2017. Clinically no evidence of recurrence. Recent mammogram within normal limits.  # Difficulty sleeping at night-not improved on melatonin.? Need for sleep study. Defer to her PCP for ordering sleep study.  # Hemoglobin 12 / microcytosis and 79 MCV. recent colonoscopy May 2017. Repeat blood counts as next visit.  # I long discussion with patient regarding weight loss/ eating healthy and exercising - that it showed decreased recurrence of breast cancer. Patient interested in exercise program ; we will refer to [facility] care program.  # follow up cbc/BMP and iron studies in 6 months.

## 2017-10-31 NOTE — Progress Notes (Deleted)
[place] NOTE  Patient Care Team: Birdie Sons, MD as PCP - General (Family Medicine) Bary Castilla, Forest Gleason, MD (General Surgery) Margarita Rana, MD as Referring Physician (Family Medicine)  CHIEF COMPLAINTS/PURPOSE OF CONSULTATION:  Breast cancer   #  Oncology History   # LEFT BREAST CANCER [pT1apN0] ER/PR/her 2 Neu- NEG. S/p Lumpec [Dr.Byrnett]; Mammosite [Dr.Crystal]; Mammaprint- 29% risk of recurrence; TC x4 [finished DEC 2017]     Carcinoma of upper-outer quadrant of left breast in female, estrogen receptor negative ([place])   04/18/2016 Initial Diagnosis    Malignant neoplasm of upper-outer quadrant of left female breast ([place])        HISTORY OF PRESENTING ILLNESS:  Theresa Acosta 67 y.o.  female with  breast cancer left-sided stage I triple negative; high risk MammaPrint is s/p cycle #4 of Chemotherapy with Taxotere Cytoxan Is here for follow-up.  Patient denies any new lumps or bumps. Appetite is good. Denies any weight loss or headaches. She does admit to difficulty sleeping.  She admits to waking up multiple times at nighttime snoring. No tingling and numbness. No swelling in the legs.  ROS: A complete 10 point review of system is done which is negative except mentioned above in history of present illness  MEDICAL HISTORY:  Past Medical History:  Diagnosis Date  . Breast cancer ([place]) 04/2016   left breast  . 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   . Cancer ([place])   . 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 Personal history of colonic polyps 2007    SURGICAL HISTORY: Past Surgical History:  Procedure Laterality Date  . 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  . BREAST BIOPSY Right 05/09/2016   neg  . BREAST  LUMPECTOMY Left 05/26/2016   chemo and radiation  . BREAST LUMPECTOMY WITH NEEDLE LOCALIZATION Left 05/26/2016   Procedure: BREAST LUMPECTOMY WITH SENTINEL LYMPH NODE BX and needle localization;  Surgeon: Robert Bellow, MD;  Location: [facility] ORS;  Service: General;  Laterality: Left;  . BREAST MAMMOSITE Left 06/26/2016  . BUNIONECTOMY  2012  . COLONOSCOPY WITH PROPOFOL N/A 03/21/2016   Procedure: COLONOSCOPY WITH PROPOFOL;  Surgeon: Robert Bellow, MD;  Location: [facility] ENDOSCOPY;  Service: Endoscopy;  Laterality: N/A;  . SENTINEL NODE BIOPSY Left 05/26/2016   Procedure: SENTINEL NODE BIOPSY;  Surgeon: Robert Bellow, MD;  Location: [facility] ORS;  Service: General;  Laterality: Left;  . 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 Marital status: Divorced    Spouse name: Not on file  . Number of children: 2  . Years of education: Not on file  . Highest education level: Not on file  Social Needs  . Financial resource strain: Not on file  . Food insecurity - worry: Not on file  . Food insecurity - inability: Not on file  . Transportation needs - medical: Not on file  . Transportation needs - non-medical: Not on file  Occupational History  . Not on file  Tobacco Use  . Smoking status: Former Smoker    Packs/day: 0.50    Years: 30.00    Pack years: 15.00    Last attempt to  quit: 02/27/2016    Years since quitting: 1.6  . Smokeless tobacco: Never Used  . Tobacco comment: 1/2 ppd since age 3 quit age 16  Substance and Sexual Activity  . Alcohol use: No    Alcohol/week: 0.0 oz  . Drug use: No  . Sexual activity: Not on file  Other Topics Concern  . Not on file  Social History Narrative  . Not on file    FAMILY HISTORY: no hx of cancer in family. Youngest of 12 siblings.  Family History  Problem Relation Age of Onset  . Healthy Sister   . Healthy Brother    . Healthy Sister   . Healthy Brother   . Healthy Brother   . Cancer Neg Hx   . Breast cancer Neg Hx     ALLERGIES:  has No Known Allergies.  MEDICATIONS:  Current Outpatient Medications  Medication Sig Dispense Refill  . cyclobenzaprine (FLEXERIL) 5 MG tablet Take 1 tablet (5 mg total) by mouth 3 (three) times daily as needed for muscle spasms. 30 tablet 0  . loratadine (CLARITIN) 10 MG tablet Take 10 mg by mouth daily.     No current facility-administered medications for this visit.       Marland Kitchen  PHYSICAL EXAMINATION: ECOG PERFORMANCE STATUS: 0 - Asymptomatic  There were no vitals filed for this visit. There were no vitals filed for this visit.  GENERAL: Well-nourished well-developed; Alert, no distress and comfortable.   Alone.  EYES: no pallor or icterus OROPHARYNX: no thrush or ulceration; good dentition  NECK: supple, no masses felt LYMPH:  no palpable lymphadenopathy in the cervical, axillary or inguinal regions LUNGS: clear to auscultation and  No wheeze or crackles HEART/CVS: regular rate & rhythm and no murmurs; No lower extremity edema ABDOMEN: abdomen soft, non-tender and normal bowel sounds Musculoskeletal:no cyanosis of digits and no clubbing  PSYCH: alert & oriented x 3 with fluent speech NEURO: no focal motor/sensory deficits SKIN:  no rashes or significant lesions  LABORATORY DATA:  I have reviewed the data as listed Lab Results  Component Value Date   WBC 4.8 03/23/2017   HGB 12.0 03/23/2017   HCT 36.7 03/23/2017   MCV 79.5 (L) 03/23/2017   PLT 296 03/23/2017   Recent Labs    11/06/16 1100 03/23/17 1340  NA 141 138  K 3.2* 3.7  CL 109 108  CO2 28 28  GLUCOSE 119* 99  BUN 12 15  CREATININE 0.61 0.79  CALCIUM 8.7* 9.0  GFRNONAA >60 >60  GFRAA >60 >60  PROT 6.5 6.7  ALBUMIN 3.4* 3.6  AST 30 18  ALT 26 16  ALKPHOS 64 79  BILITOT 0.7 0.6    RADIOGRAPHIC STUDIES: I have personally reviewed the radiological images as listed and agreed  with the findings in the report. No results found.  ASSESSMENT & PLAN:   No problem-specific Assessment & Plan notes found for this encounter.  All questions were answered. The patient knows to call the clinic with any problems, questions or concerns.    Cammie Sickle, MD 10/31/2017 1:35 PM

## 2017-12-14 ENCOUNTER — Inpatient Hospital Stay (HOSPITAL_BASED_OUTPATIENT_CLINIC_OR_DEPARTMENT_OTHER): Payer: 59 | Admitting: Internal Medicine

## 2017-12-14 ENCOUNTER — Inpatient Hospital Stay: Payer: 59 | Attending: Internal Medicine

## 2017-12-14 ENCOUNTER — Encounter: Payer: Self-pay | Admitting: Internal Medicine

## 2017-12-14 VITALS — BP 116/72 | HR 79 | Temp 97.9°F | Resp 16 | Wt 195.4 lb

## 2017-12-14 DIAGNOSIS — Z79899 Other long term (current) drug therapy: Secondary | ICD-10-CM

## 2017-12-14 DIAGNOSIS — C50412 Malignant neoplasm of upper-outer quadrant of left female breast: Secondary | ICD-10-CM | POA: Diagnosis not present

## 2017-12-14 DIAGNOSIS — Z171 Estrogen receptor negative status [ER-]: Secondary | ICD-10-CM | POA: Insufficient documentation

## 2017-12-14 DIAGNOSIS — G4733 Obstructive sleep apnea (adult) (pediatric): Secondary | ICD-10-CM | POA: Insufficient documentation

## 2017-12-14 DIAGNOSIS — Z87891 Personal history of nicotine dependence: Secondary | ICD-10-CM | POA: Diagnosis not present

## 2017-12-14 LAB — COMPREHENSIVE METABOLIC PANEL
ALBUMIN: 3.2 g/dL — AB (ref 3.5–5.0)
ALT: 13 U/L — ABNORMAL LOW (ref 14–54)
AST: 19 U/L (ref 15–41)
Alkaline Phosphatase: 73 U/L (ref 38–126)
Anion gap: 8 (ref 5–15)
BILIRUBIN TOTAL: 0.4 mg/dL (ref 0.3–1.2)
BUN: 10 mg/dL (ref 6–20)
CALCIUM: 8.8 mg/dL — AB (ref 8.9–10.3)
CO2: 26 mmol/L (ref 22–32)
Chloride: 111 mmol/L (ref 101–111)
Creatinine, Ser: 0.67 mg/dL (ref 0.44–1.00)
GFR calc Af Amer: >60 mL/min (ref >60)
GFR calc non Af Amer: >60 mL/min (ref >60)
GLUCOSE: 109 mg/dL — AB (ref 65–99)
Potassium: 3.4 mmol/L — ABNORMAL LOW (ref 3.5–5.1)
Sodium: 145 mmol/L (ref 135–145)
TOTAL PROTEIN: 6.3 g/dL — AB (ref 6.5–8.1)

## 2017-12-14 LAB — CBC WITH DIFFERENTIAL/PLATELET
BASOS ABS: 0 10*3/uL (ref 0–0.1)
BASOS PCT: 1 %
EOS PCT: 4 %
Eosinophils Absolute: 0.2 10*3/uL (ref 0–0.7)
HCT: 38.1 % (ref 35.0–47.0)
Hemoglobin: 12.3 g/dL (ref 12.0–16.0)
Lymphocytes Relative: 30 %
Lymphs Abs: 1.6 10*3/uL (ref 1.0–3.6)
MCH: 26.4 pg (ref 26.0–34.0)
MCHC: 32.2 g/dL (ref 32.0–36.0)
MCV: 82 fL (ref 80.0–100.0)
MONO ABS: 0.3 10*3/uL (ref 0.2–0.9)
Monocytes Relative: 5 %
Neutro Abs: 3.3 10*3/uL (ref 1.4–6.5)
Neutrophils Relative %: 60 %
Platelets: 284 10*3/uL (ref 150–440)
RBC: 4.65 MIL/uL (ref 3.80–5.20)
RDW: 13.1 % (ref 11.5–14.5)
WBC: 5.5 10*3/uL (ref 3.6–11.0)

## 2017-12-14 LAB — IRON AND TIBC
Iron: 85 ug/dL (ref 28–170)
SATURATION RATIOS: 25 % (ref 10.4–31.8)
TIBC: 343 ug/dL (ref 250–450)
UIBC: 258 ug/dL

## 2017-12-14 LAB — FERRITIN: Ferritin: 66 ng/mL (ref 11–307)

## 2017-12-14 NOTE — Progress Notes (Signed)
[place] NOTE  Patient Care Team: Birdie Sons, MD as PCP - General (Family Medicine) Bary Castilla, Forest Gleason, MD (General Surgery) Margarita Rana, MD as Referring Physician (Family Medicine)  CHIEF COMPLAINTS/PURPOSE OF CONSULTATION:  Breast cancer   #  Oncology History   # LEFT BREAST CANCER [pT1apN0] ER/PR/her 2 Neu- NEG. S/p Lumpec [Dr.Byrnett]; Mammosite [Dr.Crystal]; Mammaprint- 29% risk of recurrence; TC x4 [finished DEC 2017]     Carcinoma of upper-outer quadrant of left breast in female, estrogen receptor negative ([place])     HISTORY OF PRESENTING ILLNESS:  Theresa Acosta 67 y.o.  female with  breast cancer left-sided stage I triple negative status post adjuvant therapy is here for follow-up.  In the interim patient has been diagnosed with obstructive sleep apnea.  She is currently using a CPAP.  Her energy levels are adequate.  Patient denies any new lumps or bumps. Appetite is good. Denies any weight loss or headaches.  ROS: A complete 10 point review of system is done which is negative except mentioned above in history of present illness  MEDICAL HISTORY:  Past Medical History:  Diagnosis Date  . Breast cancer ([place]) 04/2016   left breast  . 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   . Cancer ([place])   . 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 Personal history of colonic polyps 2007    SURGICAL HISTORY: Past Surgical History:  Procedure Laterality Date  . 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  . BREAST BIOPSY Right 05/09/2016   neg  . BREAST LUMPECTOMY Left 05/26/2016   chemo and radiation  . BREAST LUMPECTOMY WITH NEEDLE LOCALIZATION Left 05/26/2016   Procedure: BREAST LUMPECTOMY WITH SENTINEL LYMPH NODE BX and  needle localization;  Surgeon: Robert Bellow, MD;  Location: [facility] ORS;  Service: General;  Laterality: Left;  . BREAST MAMMOSITE Left 06/26/2016  . BUNIONECTOMY  2012  . COLONOSCOPY WITH PROPOFOL N/A 03/21/2016   Procedure: COLONOSCOPY WITH PROPOFOL;  Surgeon: Robert Bellow, MD;  Location: [facility] ENDOSCOPY;  Service: Endoscopy;  Laterality: N/A;  . SENTINEL NODE BIOPSY Left 05/26/2016   Procedure: SENTINEL NODE BIOPSY;  Surgeon: Robert Bellow, MD;  Location: [facility] ORS;  Service: General;  Laterality: Left;  . 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 Marital status: Divorced    Spouse name: Not on file  . Number of children: 2  . Years of education: Not on file  . Highest education level: Not on file  Social Needs  . Financial resource strain: Not on file  . Food insecurity - worry: Not on file  . Food insecurity - inability: Not on file  . Transportation needs - medical: Not on file  . Transportation needs - non-medical: Not on file  Occupational History  . Not on file  Tobacco Use  . Smoking status: Former Smoker    Packs/day: 0.50    Years: 30.00    Pack years: 15.00    Last attempt to quit: 02/27/2016    Years since quitting: 1.8  . Smokeless tobacco: Never Used  . Tobacco comment: 1/2 ppd since age 32 quit age 58  Substance and  Sexual Activity  . Alcohol use: No    Alcohol/week: 0.0 oz  . Drug use: No  . Sexual activity: Not on file  Other Topics Concern  . Not on file  Social History Narrative  . Not on file    FAMILY HISTORY: no hx of cancer in family. Youngest of 12 siblings.  Family History  Problem Relation Age of Onset  . Healthy Sister   . Healthy Brother   . Healthy Sister   . Healthy Brother   . Healthy Brother   . Cancer Neg Hx   . Breast cancer Neg Hx     ALLERGIES:  has No Known Allergies.  MEDICATIONS:  Current  Outpatient Medications  Medication Sig Dispense Refill  . cyclobenzaprine (FLEXERIL) 5 MG tablet Take 1 tablet (5 mg total) by mouth 3 (three) times daily as needed for muscle spasms. 30 tablet 0  . loratadine (CLARITIN) 10 MG tablet Take 10 mg by mouth daily.     No current facility-administered medications for this visit.       Marland Kitchen  PHYSICAL EXAMINATION: ECOG PERFORMANCE STATUS: 0 - Asymptomatic  Vitals:   12/14/17 1513  BP: 116/72  Pulse: 79  Resp: 16  Temp: 97.9 F (36.6 C)   Filed Weights   12/14/17 1513  Weight: 195 lb 6.4 oz (88.6 kg)    GENERAL: Well-nourished well-developed; Alert, no distress and comfortable.   Alone.  EYES: no pallor or icterus OROPHARYNX: no thrush or ulceration; good dentition  NECK: supple, no masses felt LYMPH:  no palpable lymphadenopathy in the cervical, axillary or inguinal regions LUNGS: clear to auscultation and  No wheeze or crackles HEART/CVS: regular rate & rhythm and no murmurs; No lower extremity edema ABDOMEN: abdomen soft, non-tender and normal bowel sounds Musculoskeletal:no cyanosis of digits and no clubbing  PSYCH: alert & oriented x 3 with fluent speech NEURO: no focal motor/sensory deficits SKIN:  no rashes or significant lesions  LABORATORY DATA:  I have reviewed the data as listed Lab Results  Component Value Date   WBC 5.5 12/14/2017   HGB 12.3 12/14/2017   HCT 38.1 12/14/2017   MCV 82.0 12/14/2017   PLT 284 12/14/2017   Recent Labs    03/23/17 1340 12/14/17 1455  NA 138 145  K 3.7 3.4*  CL 108 111  CO2 28 26  GLUCOSE 99 109*  BUN 15 10  CREATININE 0.79 0.67  CALCIUM 9.0 8.8*  GFRNONAA >60 >60  GFRAA >60 >60  PROT 6.7 6.3*  ALBUMIN 3.6 3.2*  AST 18 19  ALT 16 13*  ALKPHOS 79 73  BILITOT 0.6 0.4    RADIOGRAPHIC STUDIES: I have personally reviewed the radiological images as listed and agreed with the findings in the report. No results found.  ASSESSMENT & PLAN:   Carcinoma of upper-outer  quadrant of left breast in female, estrogen receptor negative ([place]) Stage I triple negative breast cancer status post lumpectomy s/p MammoSite.   #  Clinically no evidence of recurrence. Recent mammogram within normal limits.  #Obstructive sleep apnea on CPAP.  Improved.   # Hypocalcemia- recommend ca+vit D one/day  # follow up as needed/ dr.byrnett/pcp  All questions were answered. The patient knows to call the clinic with any problems, questions or concerns.    Cammie Sickle, MD 12/15/2017 7:18 AM

## 2017-12-14 NOTE — Assessment & Plan Note (Addendum)
Stage I triple negative breast cancer status post lumpectomy s/p MammoSite.   #  Clinically no evidence of recurrence. Recent mammogram within normal limits.  #Obstructive sleep apnea on CPAP.  Improved.   # Hypocalcemia- recommend ca+vit D one/day  # follow up as needed/ dr.byrnett/pcp

## 2018-01-17 ENCOUNTER — Other Ambulatory Visit: Payer: Self-pay

## 2018-01-17 DIAGNOSIS — C50412 Malignant neoplasm of upper-outer quadrant of left female breast: Secondary | ICD-10-CM

## 2018-01-17 DIAGNOSIS — Z171 Estrogen receptor negative status [ER-]: Principal | ICD-10-CM

## 2018-03-19 ENCOUNTER — Ambulatory Visit
Admission: RE | Admit: 2018-03-19 | Discharge: 2018-03-19 | Disposition: A | Discharge status: Home / Self Care | Payer: 59 | Source: Ambulatory Visit | Attending: General Surgery | Admitting: General Surgery

## 2018-03-19 DIAGNOSIS — C50412 Malignant neoplasm of upper-outer quadrant of left female breast: Secondary | ICD-10-CM

## 2018-03-19 DIAGNOSIS — Z171 Estrogen receptor negative status [ER-]: Principal | ICD-10-CM

## 2018-03-19 DIAGNOSIS — R928 Other abnormal and inconclusive findings on diagnostic imaging of breast: Secondary | ICD-10-CM | POA: Diagnosis not present

## 2018-03-19 HISTORY — DX: Personal history of irradiation: Z92.3

## 2018-03-19 HISTORY — DX: Personal history of antineoplastic chemotherapy: Z92.21

## 2018-03-26 ENCOUNTER — Ambulatory Visit: Payer: 59 | Admitting: General Surgery

## 2018-04-23 ENCOUNTER — Ambulatory Visit: Payer: 59 | Admitting: General Surgery

## 2018-05-14 ENCOUNTER — Telehealth: Payer: Self-pay | Admitting: Hematology & Oncology

## 2018-05-15 ENCOUNTER — Telehealth: Payer: Self-pay | Admitting: *Deleted

## 2018-05-15 DIAGNOSIS — Z122 Encounter for screening for malignant neoplasm of respiratory organs: Secondary | ICD-10-CM

## 2018-05-15 DIAGNOSIS — Z87891 Personal history of nicotine dependence: Secondary | ICD-10-CM

## 2018-05-15 NOTE — Telephone Encounter (Signed)
Notified patient that annual lung cancer screening low dose CT scan is due currently or will be in near future. Confirmed that patient is within the age range of 55-77, and asymptomatic, (no signs or symptoms of lung cancer). Patient denies illness that would prevent curative treatment for lung cancer if found. Verified smoking history, (former, quit 02/27/16, 60 pack year). The shared decision making visit was done 04/21/16. Patient is agreeable for CT scan being scheduled.

## 2018-05-24 ENCOUNTER — Ambulatory Visit: Admission: RE | Admit: 2018-05-24 | Payer: 59 | Source: Ambulatory Visit

## 2018-06-01 ENCOUNTER — Telehealth: Payer: Self-pay

## 2018-06-01 NOTE — Telephone Encounter (Signed)
Call on 06-01-18 at 12:03 left message

## 2018-06-04 ENCOUNTER — Telehealth: Payer: Self-pay | Admitting: Nurse Practitioner

## 2018-06-05 ENCOUNTER — Telehealth: Payer: Self-pay | Admitting: *Deleted

## 2018-06-05 DIAGNOSIS — Z87891 Personal history of nicotine dependence: Secondary | ICD-10-CM

## 2018-06-05 NOTE — Telephone Encounter (Signed)
Patient has been notified that annual lung cancer screening low dose CT scan is due currently or will be in near future. Confirmed that patient is within the age range of 55-77, and asymptomatic, (no signs or symptoms of lung cancer). Patient denies illness that would prevent curative treatment for lung cancer if found. Verified smoking history, (former, quit 2017, 60 pack year). The shared decision making visit was done 04/21/16. Patient is agreeable for CT scan being scheduled.

## 2018-06-07 ENCOUNTER — Ambulatory Visit
Admission: RE | Admit: 2018-06-07 | Discharge: 2018-06-07 | Disposition: A | Discharge status: Home / Self Care | Payer: 59 | Source: Ambulatory Visit | Attending: Oncology | Admitting: Oncology

## 2018-06-07 DIAGNOSIS — Z87891 Personal history of nicotine dependence: Secondary | ICD-10-CM | POA: Insufficient documentation

## 2018-06-07 DIAGNOSIS — Z122 Encounter for screening for malignant neoplasm of respiratory organs: Secondary | ICD-10-CM | POA: Diagnosis not present

## 2018-06-18 ENCOUNTER — Encounter: Payer: Self-pay | Admitting: *Deleted

## 2018-06-20 ENCOUNTER — Encounter: Payer: Self-pay | Admitting: General Surgery

## 2018-06-20 ENCOUNTER — Ambulatory Visit (INDEPENDENT_AMBULATORY_CARE_PROVIDER_SITE_OTHER): Payer: 59 | Admitting: General Surgery

## 2018-06-20 VITALS — BP 140/70 | HR 75 | Resp 14 | Ht 64.0 in | Wt 195.0 lb

## 2018-06-20 DIAGNOSIS — Z171 Estrogen receptor negative status [ER-]: Secondary | ICD-10-CM

## 2018-06-20 DIAGNOSIS — C50412 Malignant neoplasm of upper-outer quadrant of left female breast: Secondary | ICD-10-CM

## 2018-06-20 NOTE — Progress Notes (Signed)
Patient ID: Theresa Acosta, female   DOB: 12/18/1950, 67 y.o.   MRN: [id number]  Chief Complaint  Patient presents with  . Follow-up    HPI Theresa Acosta is a 67 y.o. female who presents for a breast evaluation. The most recent mammogram was done on 03/19/2018.  Patient does perform regular self breast checks and gets regular mammograms done.   The patient reports that she has been more tired, but she is also gained significant weight over the past year. HPI  Past Medical History:  Diagnosis Date  . Breast cancer ([place]) 04/2016   left breast  . 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   . Cancer ([place])   . 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 Personal history of chemotherapy 2017   left breast cancer  . Personal history of colonic polyps 2007  . Personal history of radiation therapy 2017   left breast cancer     Past Surgical History:  Procedure Laterality Date  . 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  . BREAST BIOPSY Right 05/09/2016   neg  . BREAST LUMPECTOMY Left 05/26/2016   chemo and radiation  . BREAST LUMPECTOMY WITH NEEDLE LOCALIZATION Left 05/26/2016   Procedure: BREAST LUMPECTOMY WITH SENTINEL LYMPH NODE BX and needle localization;  Surgeon: Robert Bellow, MD;  Location: [facility] ORS;  Service: General;  Laterality: Left;  . BREAST MAMMOSITE Left 06/26/2016  . BUNIONECTOMY  2012  . COLONOSCOPY WITH PROPOFOL N/A 03/21/2016   Procedure: COLONOSCOPY WITH PROPOFOL;  Surgeon: Robert Bellow, MD;  Location: [facility] ENDOSCOPY;  Service: Endoscopy;  Laterality: N/A;  . SENTINEL NODE BIOPSY Left 05/26/2016   Procedure: SENTINEL NODE BIOPSY;  Surgeon: Robert Bellow, MD;  Location: [facility] ORS;  Service: General;  Laterality: Left;  . TUBAL LIGATION   1977    Family History  Problem Relation Age of Onset  . Healthy Sister   . Healthy Brother   . Healthy Sister   . Healthy Brother   . Healthy Brother   . Cancer Neg Hx   . Breast cancer Neg Hx     Social History Social History   Tobacco Use  . Smoking status: Former Smoker    Packs/day: 0.50    Years: 30.00    Pack years: 15.00    Last attempt to quit: 02/27/2016    Years since quitting: 2.3  . Smokeless tobacco: Never Used  . Tobacco comment: 1/2 ppd since age 80 quit age 61  Substance Use Topics  . Alcohol use: No    Alcohol/week: 0.0 standard drinks  . Drug use: No    No Known Allergies  Current Outpatient Medications  Medication Sig Dispense Refill  . loratadine (CLARITIN) 10 MG tablet Take 10 mg by mouth daily.     No current facility-administered medications for this visit.     Review of Systems Review of Systems  Constitutional: Negative.   Respiratory: Negative.   Cardiovascular: Negative.     Blood pressure 140/70, pulse 75, resp. rate 14, height 5\' 4"  (1.626 m), weight 195 lb (88.5 kg).  Physical Exam Physical Exam  Constitutional: She is oriented to person, place, and time. She appears well-developed and well-nourished.  Eyes: Conjunctivae are normal. No scleral icterus.  Neck: Neck supple.  Cardiovascular: Normal rate, regular  rhythm and normal heart sounds.  Pulmonary/Chest: Effort normal and breath sounds normal. Right breast exhibits no inverted nipple, no mass, no nipple discharge, no skin change and no tenderness. Left breast exhibits no inverted nipple, no mass, no nipple discharge, no skin change and no tenderness.    Lymphadenopathy:    She has no cervical adenopathy.    She has no axillary adenopathy.  Neurological: She is alert and oriented to person, place, and time.  Skin: Skin is warm and dry.    Data Reviewed Mar 25, 2018 bilateral diagnostic mammograms reviewed.  Diminished surgical changes post accelerated partial breast  radiation noted.  BI-RADS-2.  Assessment    No evidence of recurrent breast cancer.    Plan   The patient has been asked to return to the office in nine months with a bilateral diagnostic mammogram order by Dr. Rogue Bussing,.The patient is aware to call back for any questions or concerns.   HPI, Physical Exam, Assessment and Plan have been scribed under the direction and in the presence of Hervey Ard, MD.  Gaspar Cola, CMA  I have completed the exam and reviewed the above documentation for accuracy and completeness.  I agree with the above.  Haematologist has been used and any errors in dictation or transcription are unintentional.  Hervey Ard, M.D., F.A.C.S.  Forest Gleason Garret Teale 06/20/2018, 8:40 PM

## 2018-07-09 ENCOUNTER — Ambulatory Visit (INDEPENDENT_AMBULATORY_CARE_PROVIDER_SITE_OTHER): Payer: 59 | Admitting: Family Medicine

## 2018-07-09 ENCOUNTER — Encounter: Payer: Self-pay | Admitting: Family Medicine

## 2018-07-09 VITALS — BP 131/81 | HR 66 | Temp 98.0°F | Resp 16 | Ht 64.0 in | Wt 193.0 lb

## 2018-07-09 DIAGNOSIS — Z6833 Body mass index (BMI) 33.0-33.9, adult: Secondary | ICD-10-CM

## 2018-07-09 DIAGNOSIS — G4733 Obstructive sleep apnea (adult) (pediatric): Secondary | ICD-10-CM

## 2018-07-09 DIAGNOSIS — E7800 Pure hypercholesterolemia, unspecified: Secondary | ICD-10-CM | POA: Diagnosis not present

## 2018-07-09 DIAGNOSIS — Z0000 Encounter for general adult medical examination without abnormal findings: Secondary | ICD-10-CM | POA: Diagnosis not present

## 2018-07-09 DIAGNOSIS — E2839 Other primary ovarian failure: Secondary | ICD-10-CM

## 2018-07-09 DIAGNOSIS — Z23 Encounter for immunization: Secondary | ICD-10-CM | POA: Diagnosis not present

## 2018-07-09 DIAGNOSIS — Z9989 Dependence on other enabling machines and devices: Secondary | ICD-10-CM | POA: Diagnosis not present

## 2018-07-09 DIAGNOSIS — E669 Obesity, unspecified: Secondary | ICD-10-CM

## 2018-07-09 DIAGNOSIS — E559 Vitamin D deficiency, unspecified: Secondary | ICD-10-CM | POA: Diagnosis not present

## 2018-07-09 NOTE — Progress Notes (Signed)
Patient: Theresa Acosta, Female    DOB: [birth year], 67 y.o.   MRN: [id number] Visit Date: 07/09/2018  Today's Provider: Lelon Huh, MD   Chief Complaint  Patient presents with  . Annual Exam   Subjective:    Annual physical exam Theresa Acosta is a 67 y.o. female who presents today for health maintenance and complete physical. She feels fairly well. She reports no regular exercising. She reports she is sleeping fairly well. She is followed by Dr. Bary Castilla for history of breast cancer.   ----------------------------------------------------------------- Vitamin D Deficiency: Patient was last seen for this problem 1 year ago. Management during that visit includes advising patient to make sure she takes at least 2,000units of Vitamin D3 daily. Patient reports poor compliance with treatment.  Elevated Blood Glucose: Patient was last seen for this problem 1 year ago and no changes were made. Patient reports poor compliance with watching her diet.  OSA: Patient was last seen for this problem 1 year ago. Sleep study was ordered showing moderate to severe sleep apnea. CPAP was initiated. Today patient comes in reporting using CPAP every night, but does not usually stay on through the night.    Review of Systems  Constitutional: Negative for chills, fatigue and fever.  HENT: Negative for congestion, ear pain, rhinorrhea, sneezing and sore throat.   Eyes: Negative.  Negative for pain and redness.  Respiratory: Negative for cough, shortness of breath and wheezing.   Cardiovascular: Negative for chest pain and leg swelling.  Gastrointestinal: Negative for abdominal pain, blood in stool, constipation, diarrhea and nausea.  Endocrine: Negative for polydipsia and polyphagia.  Genitourinary: Negative.  Negative for dysuria, flank pain, hematuria, pelvic pain, vaginal bleeding and vaginal discharge.  Musculoskeletal: Negative for arthralgias, back pain, gait problem and joint swelling.    Skin: Negative for rash.  Neurological: Negative.  Negative for dizziness, tremors, seizures, weakness, light-headedness, numbness and headaches.  Hematological: Negative for adenopathy.  Psychiatric/Behavioral: Negative.  Negative for behavioral problems, confusion and dysphoric mood. The patient is not nervous/anxious and is not hyperactive.     Social History      She  reports that she quit smoking about 2 years ago. She has a 15.00 pack-year smoking history. She has never used smokeless tobacco. She reports that she does not drink alcohol or use drugs.       Social History   Socioeconomic History  . Marital status: Divorced    Spouse name: Not on file  . Number of children: 2  . Years of education: Not on file  . Highest education level: Not on file  Occupational History  . Not on file  Social Needs  . Financial resource strain: Not on file  . Food insecurity:    Worry: Not on file    Inability: Not on file  . Transportation needs:    Medical: Not on file    Non-medical: Not on file  Tobacco Use  . Smoking status: Former Smoker    Packs/day: 0.50    Years: 30.00    Pack years: 15.00    Last attempt to quit: 02/27/2016    Years since quitting: 2.3  . Smokeless tobacco: Never Used  . Tobacco comment: 1/2 ppd since age 58 quit age 67  Substance and Sexual Activity  . Alcohol use: No    Alcohol/week: 0.0 standard drinks  . Drug use: No  . Sexual activity: Not on file  Lifestyle  . Physical activity:  Days per week: Not on file    Minutes per session: Not on file  . Stress: Not on file  Relationships  . Social connections:    Talks on phone: Not on file    Gets together: Not on file    Attends religious service: Not on file    Active member of club or organization: Not on file    Attends meetings of clubs or organizations: Not on file    Relationship status: Not on file  Other Topics Concern  . Not on file  Social History Narrative  . Not on file    Past  Medical History:  Diagnosis Date  . Breast cancer ([place]) 04/2016   left breast  . 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   . Cancer ([place])   . 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 Personal history of chemotherapy 2017   left breast cancer  . Personal history of colonic polyps 2007  . Personal history of radiation therapy 2017   left breast cancer      Patient Active Problem List   Diagnosis Date Noted  . OSA on CPAP 04/30/2017  . Breast mass, right 05/09/2016  . Former light cigarette smoker (1-9 per day) 04/20/2016  . Carcinoma of upper-outer quadrant of left breast in female, estrogen receptor negative ([place]) 04/18/2016  . Blood in the urine 03/29/2016  . Hypercholesteremia 03/29/2016  . Blood glucose elevated 03/29/2016  . Obesity 03/29/2016  . Vitamin D deficiency 03/29/2016  . History of colonic polyps 03/03/2016    Past Surgical History:  Procedure Laterality Date  . 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  . BREAST BIOPSY Right 05/09/2016   neg  . BREAST LUMPECTOMY Left 05/26/2016   chemo and radiation  . BREAST LUMPECTOMY WITH NEEDLE LOCALIZATION Left 05/26/2016   Procedure: BREAST LUMPECTOMY WITH SENTINEL LYMPH NODE BX and needle localization;  Surgeon: Robert Bellow, MD;  Location: [facility] ORS;  Service: General;  Laterality: Left;  . BREAST MAMMOSITE Left 06/26/2016  . BUNIONECTOMY  2012  . COLONOSCOPY WITH PROPOFOL N/A 03/21/2016   Procedure: COLONOSCOPY WITH PROPOFOL;  Surgeon: Robert Bellow, MD;  Location: [facility] END[facility]COPY;  Service: Endoscopy;  Laterality: N/A;  . SENTINEL NODE BIOPSY Left 05/26/2016   Procedure: SENTINEL NODE BIOPSY;  Surgeon: Robert Bellow, MD;  Location: [facility] ORS;  Service: General;  Laterality: Left;  .  TUBAL LIGATION  1977    Family History        Family Status  Relation Name Status  . Father  Deceased at age 35  . Mother  Deceased at age 71       pneumonia  . Sister  Alive  . Brother  Alive  . Sister  Alive  . Brother  Alive  . Brother  Alive  . Neg Hx  (Not Specified)        Her family history includes Healthy in her brother, brother, brother, sister, and sister. There is no history of Cancer or Breast cancer.      No Known Allergies   Current Outpatient Medications:  .  loratadine (CLARITIN) 10 MG tablet, Take 10 mg by mouth daily., Disp: , Rfl:    Patient Care Team: Birdie Sons, MD as PCP - General (Family Medicine) Bary Castilla Forest Gleason, MD (  General Surgery)      Objective:   Vitals: BP 131/81 (BP Location: Left Arm, Patient Position: Sitting, Cuff Size: Large)   Pulse 66   Temp 98 F (36.7 C) (Oral)   Resp 16   Ht 5\' 4"  (1.626 m)   Wt 193 lb (87.5 kg)   SpO2 96% Comment: room air  BMI 33.13 kg/m    Vitals:   07/09/18 1522  BP: 131/81  Pulse: 66  Resp: 16  Temp: 98 F (36.7 C)  TempSrc: Oral  SpO2: 96%  Weight: 193 lb (87.5 kg)  Height: 5\' 4"  (1.626 m)     Physical Exam   General Appearance:    Alert, cooperative, no distress, appears stated age  Head:    Normocephalic, without obvious abnormality, atraumatic  Eyes:    PERRL, conjunctiva/corneas clear, EOM's intact, fundi    benign, both eyes  Ears:    Normal TM's and external ear canals, both ears  Nose:   Nares normal, septum midline, mucosa normal, no drainage    or sinus tenderness  Throat:   Lips, mucosa, and tongue normal; teeth and gums normal  Neck:   Supple, symmetrical, trachea midline, no adenopathy;    thyroid:  no enlargement/tenderness/nodules; no carotid   bruit or JVD  Back:     Symmetric, no curvature, ROM normal, no CVA tenderness  Lungs:     Clear to auscultation bilaterally, respirations unlabored  Chest Wall:    No tenderness or deformity   Heart:    Regular rate  and rhythm, S1 and S2 normal, no murmur, rub   or gallop  Breast Exam:    deferred  Abdomen:     Soft, non-tender, bowel sounds active all four quadrants,    no masses, no organomegaly  Pelvic:    deferred  Extremities:   Extremities normal, atraumatic, no cyanosis or edema  Pulses:   2+ and symmetric all extremities  Skin:   Skin color, texture, turgor normal, no rashes or lesions  Lymph nodes:   Cervical, supraclavicular, and axillary nodes normal  Neurologic:   CNII-XII intact, normal strength, sensation and reflexes    throughout     Depression Screen PHQ 2/9 Scores 07/09/2018 04/02/2017 02/01/2017 08/02/2016  PHQ - 2 Score 0 0 0 0  PHQ- 9 Score 3 6 - -   Audit-C Alcohol Use Screening   Alcohol Use Disorder Test (AUDIT) 07/09/2018  1. How often do you have a drink containing alcohol? 1  2. How many drinks containing alcohol do you have on a typical day when you are drinking? 0  3. How often do you have six or more drinks on one occasion? 0  AUDIT-C Score 1  Intervention/Follow-up AUDIT Score <7 follow-up not indicated    A score of 3 or more in women, and 4 or more in men indicates increased risk for alcohol abuse, EXCEPT if all of the points are from question 1   Cognitive Testing - 6-CIT  Correct? Score   What year is it? yes 0 0 or 4  What month is it? yes 0 0 or 3  Memorize:    Pia Mau,  43,  [place],      What time is it? (within 1 hour) yes 0 0 or 3  Count backwards from 20 yes 0 0, 2, or 4  Name the months of the year yes 0 0, 2, or 4  Repeat name & address above yes 0  0, 2, 4, 6, 8, or 10       TOTAL SCORE  0/28   Interpretation:  Normal  Normal (0-7) Abnormal (8-28)     Assessment & Plan:     Routine Health Maintenance and Physical Exam  Exercise Activities and Dietary recommendations Goals    . Exercise 150 minutes per week (moderate activity)       Immunization History  Administered Date(s) Administered  . Td 01/04/2015  . Tdap  01/04/2015    Health Maintenance  Topic Date Due  . HIV Screening  09/28/1967  . DEXA SCAN  09/27/2017  . PNA vac Low Risk Adult (1 of 2 - PCV13) 09/27/2017  . INFLUENZA VACCINE  05/30/2018  . PAP SMEAR  03/30/2019  . MAMMOGRAM  03/19/2020  . TETANUS/TDAP  01/03/2025  . COLONOSCOPY  03/21/2026  . Hepatitis C Screening  Completed     Discussed health benefits of physical activity, and encouraged her to engage in regular exercise appropriate for her age and condition.    --------------------------------------------------------------------  1. Annual physical exam Generally doing well.  - Comprehensive metabolic panel - Lipid panel  2. OSA on CPAP Using CPAP every nighg.   3. Hypercholesteremia  - Comprehensive metabolic panel - Lipid panel  4. Vitamin D deficiency  - VITAMIN D 25 Hydroxy (Vit-D Deficiency, Fractures)  5. Class 1 obesity without serious comorbidity with body mass index (BMI) of 33.0 to 33.9 in adult, unspecified obesity type Counseled regarding prudent diet and regular exercise.   6. Estrogen deficiency  - DG Bone Density; Future  7. Need for shingles vaccine  - Varicella-zoster vaccine IM   Lelon Huh, MD  [facility]

## 2018-07-10 DIAGNOSIS — E559 Vitamin D deficiency, unspecified: Secondary | ICD-10-CM | POA: Diagnosis not present

## 2018-07-10 DIAGNOSIS — E7800 Pure hypercholesterolemia, unspecified: Secondary | ICD-10-CM | POA: Diagnosis not present

## 2018-07-10 DIAGNOSIS — Z0000 Encounter for general adult medical examination without abnormal findings: Secondary | ICD-10-CM | POA: Diagnosis not present

## 2018-07-11 ENCOUNTER — Telehealth: Payer: Self-pay

## 2018-07-11 LAB — COMPREHENSIVE METABOLIC PANEL
ALK PHOS: 75 IU/L (ref 39–117)
ALT: 8 IU/L (ref 0–32)
AST: 13 IU/L (ref 0–40)
Albumin/Globulin Ratio: 1.5 (ref 1.2–2.2)
Albumin: 3.7 g/dL (ref 3.6–4.8)
BUN/Creatinine Ratio: 17 (ref 12–28)
BUN: 12 mg/dL (ref 8–27)
Bilirubin Total: 0.4 mg/dL (ref 0.0–1.2)
CALCIUM: 9.1 mg/dL (ref 8.7–10.3)
CO2: 25 mmol/L (ref 20–29)
CREATININE: 0.71 mg/dL (ref 0.57–1.00)
Chloride: 106 mmol/L (ref 96–106)
GFR calc Af Amer: 103 mL/min/{1.73_m2} (ref >59)
GFR, EST NON AFRICAN AMERICAN: 90 mL/min/{1.73_m2} (ref >59)
GLUCOSE: 99 mg/dL (ref 65–99)
Globulin, Total: 2.4 g/dL (ref 1.5–4.5)
POTASSIUM: 4 mmol/L (ref 3.5–5.2)
Sodium: 143 mmol/L (ref 134–144)
Total Protein: 6.1 g/dL (ref 6.0–8.5)

## 2018-07-11 LAB — LIPID PANEL
CHOL/HDL RATIO: 4.3 ratio (ref 0.0–4.4)
Cholesterol, Total: 173 mg/dL (ref 100–199)
HDL: 40 mg/dL (ref >39)
LDL Calculated: 123 mg/dL — ABNORMAL HIGH (ref 0–99)
Triglycerides: 52 mg/dL (ref 0–149)
VLDL Cholesterol Cal: 10 mg/dL (ref 5–40)

## 2018-07-11 LAB — VITAMIN D 25 HYDROXY (VIT D DEFICIENCY, FRACTURES): VIT D 25 HYDROXY: 14.1 ng/mL — AB (ref 30.0–100.0)

## 2018-07-11 NOTE — Telephone Encounter (Signed)
Patient advised of results. Patient says she has not been taking Vitamin D at all. Patient agrees to start taking Vitamin D3 1,000units daily as stated below.

## 2018-07-11 NOTE — Telephone Encounter (Signed)
-----   Message from Birdie Sons, MD sent at 07/11/2018  7:54 AM EDT ----- Vitamin d levels are low. Please see if she is taking a vitamin d supplement. If she is, then she needs to double it. If not she needs to start vitamin d3 1000 units daily. Rest of labs are normal.

## 2018-07-17 ENCOUNTER — Ambulatory Visit
Admission: RE | Admit: 2018-07-17 | Discharge: 2018-07-17 | Disposition: A | Discharge status: Home / Self Care | Payer: 59 | Source: Ambulatory Visit | Attending: Family Medicine | Admitting: Family Medicine

## 2018-07-17 DIAGNOSIS — M810 Age-related osteoporosis without current pathological fracture: Secondary | ICD-10-CM | POA: Diagnosis not present

## 2018-07-17 DIAGNOSIS — M85852 Other specified disorders of bone density and structure, left thigh: Secondary | ICD-10-CM | POA: Diagnosis not present

## 2018-07-17 DIAGNOSIS — E2839 Other primary ovarian failure: Secondary | ICD-10-CM | POA: Diagnosis not present

## 2018-07-18 ENCOUNTER — Encounter: Payer: Self-pay | Admitting: Family Medicine

## 2018-07-18 DIAGNOSIS — M810 Age-related osteoporosis without current pathological fracture: Secondary | ICD-10-CM | POA: Insufficient documentation

## 2018-07-19 ENCOUNTER — Telehealth: Payer: Self-pay | Admitting: *Deleted

## 2018-07-19 ENCOUNTER — Encounter: Payer: Self-pay | Admitting: Family Medicine

## 2018-07-19 ENCOUNTER — Ambulatory Visit (INDEPENDENT_AMBULATORY_CARE_PROVIDER_SITE_OTHER): Payer: 59 | Admitting: Family Medicine

## 2018-07-19 ENCOUNTER — Other Ambulatory Visit (HOSPITAL_COMMUNITY)
Admission: RE | Admit: 2018-07-19 | Discharge: 2018-07-19 | Disposition: A | Discharge status: Home / Self Care | Payer: 59 | Source: Ambulatory Visit | Attending: Family Medicine | Admitting: Family Medicine

## 2018-07-19 VITALS — BP 131/76 | HR 72 | Temp 98.0°F | Resp 16 | Wt 191.0 lb

## 2018-07-19 DIAGNOSIS — R300 Dysuria: Secondary | ICD-10-CM | POA: Insufficient documentation

## 2018-07-19 DIAGNOSIS — N760 Acute vaginitis: Secondary | ICD-10-CM | POA: Diagnosis not present

## 2018-07-19 LAB — POCT URINALYSIS DIPSTICK
APPEARANCE: NORMAL
GLUCOSE UA: NEGATIVE
Nitrite, UA: NEGATIVE
ODOR: NORMAL
Protein, UA: POSITIVE — AB
Spec Grav, UA: >=1.03 — AB (ref 1.010–1.025)
Urobilinogen, UA: 0.2 E.U./dL
pH, UA: 6 (ref 5.0–8.0)

## 2018-07-19 MED ORDER — ALENDRONATE SODIUM 70 MG PO TABS: 70.0000 mg | ORAL_TABLET | ORAL | 12 refills | Status: DC

## 2018-07-19 NOTE — Progress Notes (Signed)
Patient: Theresa Acosta Female    DOB: May 05, 1951   67 y.o.   MRN: [id number] Visit Date: 07/19/2018  Today's Provider: Lelon Huh, MD   No chief complaint on file.  Subjective:    HPI  Patient states she has been having burning upon urination for 1 month. Patient states she is also having discomfort is the vaginal area and it feels like she's swollen inside. Has has sore in vulvar off and on for several weeks and tried OTC vaginal crams which didn't help.   Her last pap was in 2017, was normal and HPV negative, but she requests pap be done today due heightened concerns regarding female cancers.   No Known Allergies   Current Outpatient Medications:  .  alendronate (FOSAMAX) 70 MG tablet, Take 1 tablet (70 mg total) by mouth every 7 (seven) days. Take with a full glass of water on an empty stomach., Disp: 4 tablet, Rfl: 12 .  loratadine (CLARITIN) 10 MG tablet, Take 10 mg by mouth daily., Disp: , Rfl:   Review of Systems  Constitutional: Negative for appetite change, chills, fatigue and fever.  Respiratory: Negative for chest tightness and shortness of breath.   Cardiovascular: Negative for chest pain and palpitations.  Gastrointestinal: Negative for abdominal pain, nausea and vomiting.  Neurological: Negative for dizziness and weakness.    Social History   Tobacco Use  . Smoking status: Former Smoker    Packs/day: 0.50    Years: 30.00    Pack years: 15.00    Last attempt to quit: 02/27/2016    Years since quitting: 2.3  . Smokeless tobacco: Never Used  . Tobacco comment: 1/2 ppd since age 52 quit age 53  Substance Use Topics  . Alcohol use: No    Alcohol/week: 0.0 standard drinks   Objective:   BP 131/76 (BP Location: Right Arm, Patient Position: Sitting, Cuff Size: Large)   Pulse 72   Temp 98 F (36.7 C) (Oral)   Resp 16   Wt 191 lb (86.6 kg)   SpO2 97%   BMI 32.79 kg/m     Physical Exam  General appearance: alert, well developed, well  nourished, cooperative and in no distress Head: Normocephalic, without obvious abnormality, atraumatic Respiratory: Respirations even and unlabored, normal respiratory rate Gyn: several small areas of inflammation of vulva and periurethral area. Small amount of cervical discharge. No discrete lesions.   Results for orders placed or performed in visit on 07/19/18  POCT urinalysis dipstick  Result Value Ref Range   Color, UA Dark Yellow    Clarity, UA Slightly Cloudy    Glucose, UA Negative Negative   Bilirubin, UA Small    Ketones, UA Trace    Spec Grav, UA >=1.030 (A) 1.010 - 1.025   Blood, UA Moderate Hemolyzed    pH, UA 6.0 5.0 - 8.0   Protein, UA Positive (A) Negative   Urobilinogen, UA 0.2 0.2 or 1.0 E.U./dL   Nitrite, UA Neg    Leukocytes, UA Small (1+) (A) Negative   Appearance Normal    Odor Normal        Assessment & Plan:     1. Dysuria  - POCT urinalysis dipstick - Cytology - PAP - CULTURE, URINE COMPREHENSIVE  2. Vulvovaginitis Likely yeast vaginitis. Can use OTC antiyeast creams. Pap to include gn/chlam, BV, trich and candida testing.        Lelon Huh, MD  [facility]

## 2018-07-21 LAB — CULTURE, URINE COMPREHENSIVE

## 2018-07-23 ENCOUNTER — Other Ambulatory Visit: Payer: Self-pay | Admitting: Family Medicine

## 2018-07-23 LAB — CYTOLOGY - PAP
Bacterial vaginitis: NEGATIVE
CANDIDA VAGINITIS: POSITIVE — AB
DIAGNOSIS: NEGATIVE

## 2018-07-23 MED ORDER — FLUCONAZOLE 150 MG PO TABS: 150.0000 mg | ORAL_TABLET | Freq: Once | ORAL | 0 refills | Status: AC

## 2018-07-23 NOTE — Telephone Encounter (Signed)
Patient has been advised. KW 

## 2018-07-23 NOTE — Telephone Encounter (Addendum)
Have sent prescription diflucan. Pap test showed presents of yeast, but was otherwise normal. No need to have any more pap tests in the future.

## 2018-07-23 NOTE — Telephone Encounter (Signed)
Pt stated when she was here for OV with Dr. Caryn Section on 07/19/18 he was going to send in an Rx for Diflucan to [facility]. Pt is requesting that be sent in today if possible . Please advise. Thanks TNP

## 2018-07-23 NOTE — Telephone Encounter (Signed)
Please advise 

## 2018-07-24 IMAGING — MG MM DIGITAL DIAGNOSTIC BILAT W/ TOMO W/ CAD
8 of 13 series · 8 of 29 positions shown · non-contrast
Comparison: Previous exam(s).

CLINICAL DATA: History of left breast cancer, diagnosed in 2111.
Patient has completed lumpectomy, radiation, and chemotherapy.

EXAM:
2D DIGITAL DIAGNOSTIC BILATERAL MAMMOGRAM WITH CAD AND ADJUNCT TOMO

[L LM]
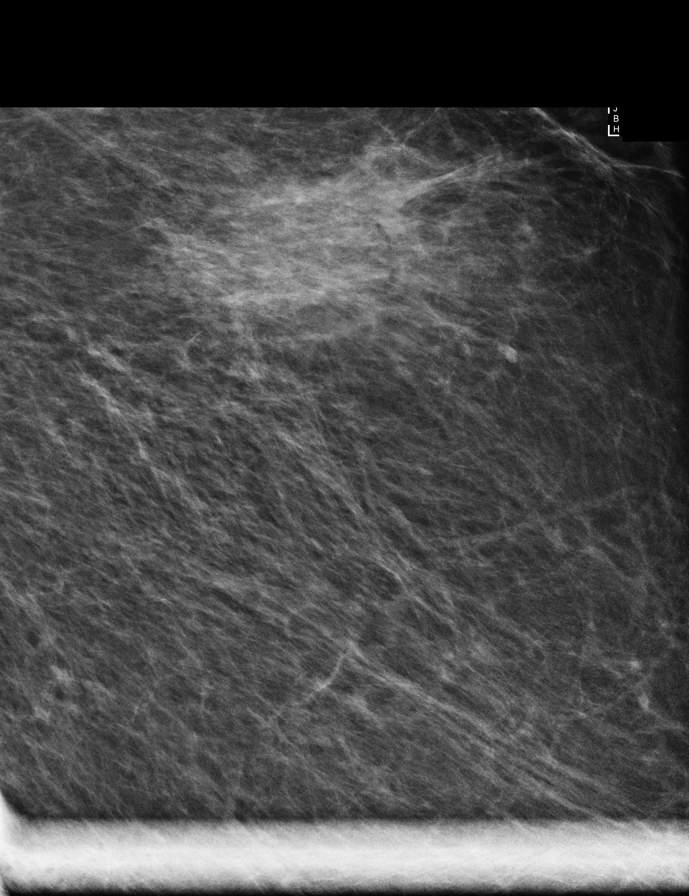

[R CC synth-2D]
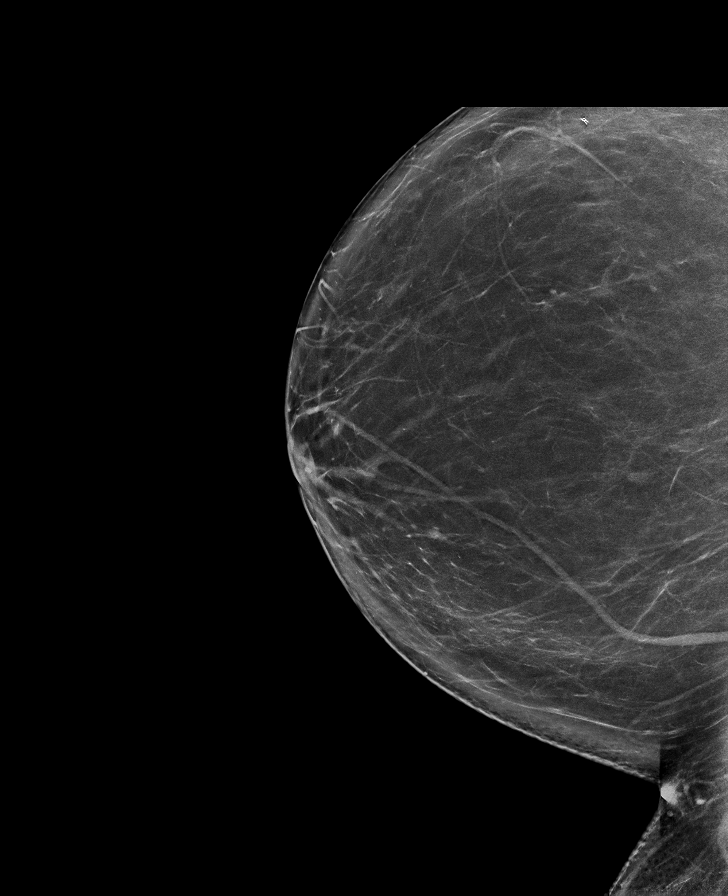

[L CC]
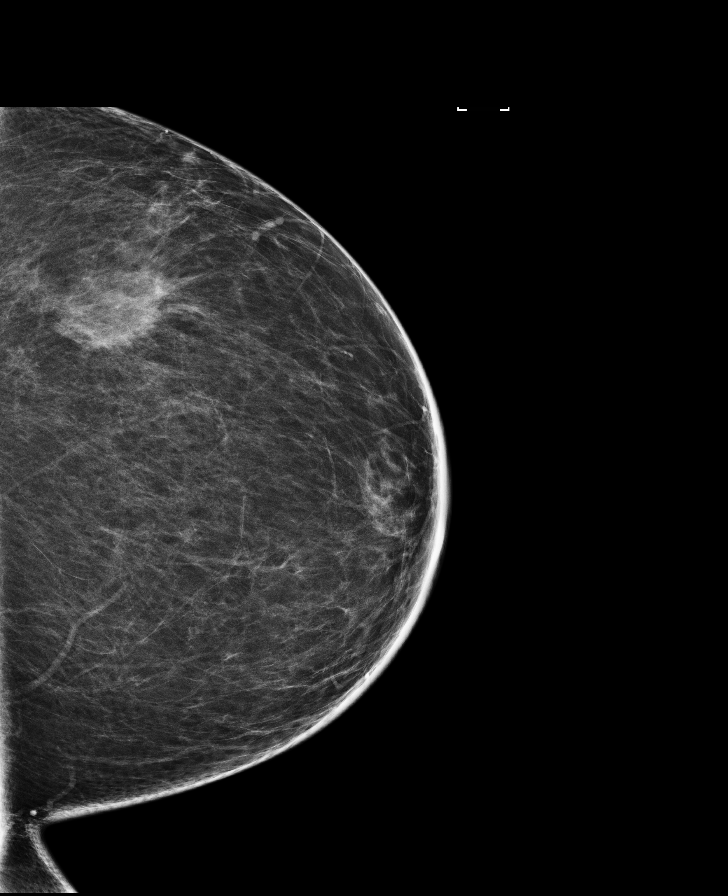

[L MLO]
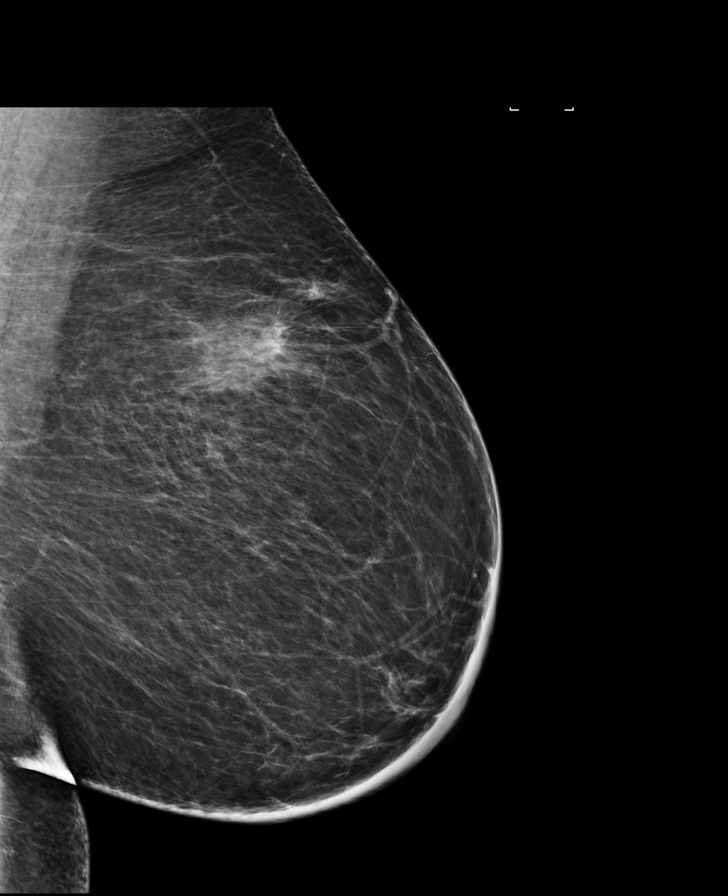

[L CC synth-2D]
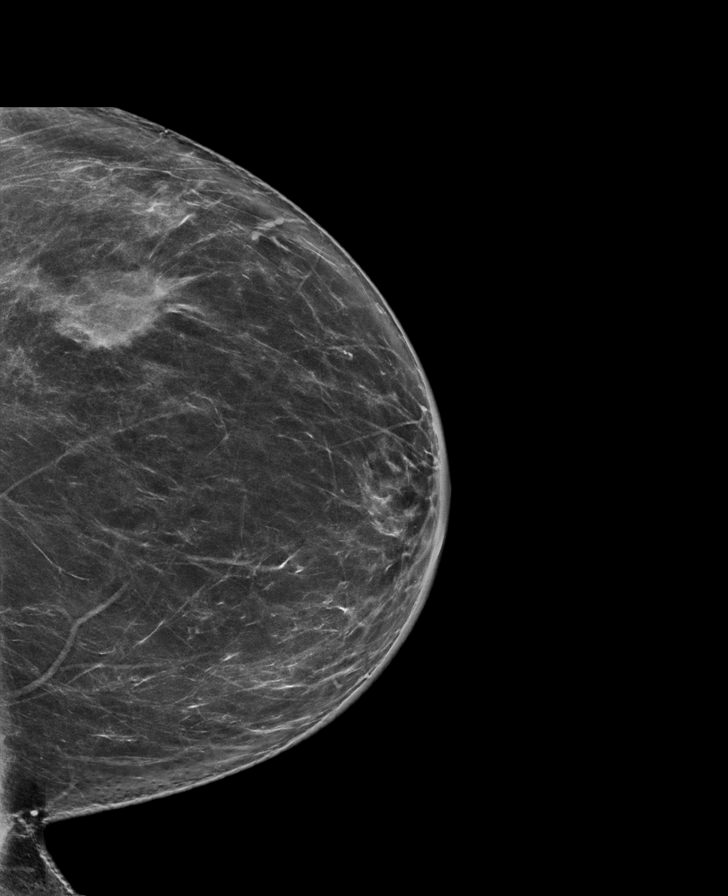

[R MLO synth-2D]
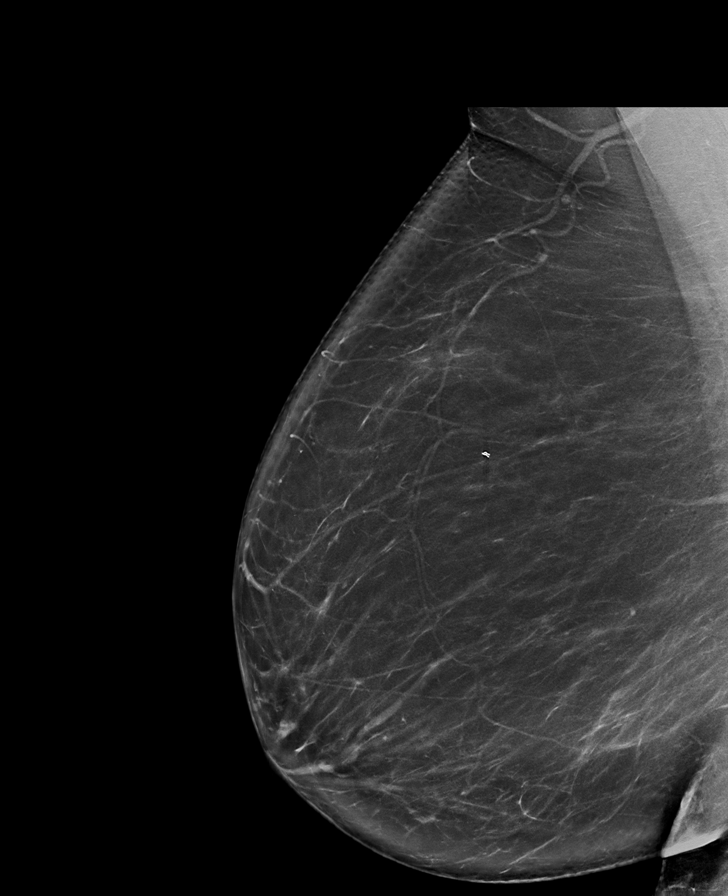

[L MLO synth-2D]
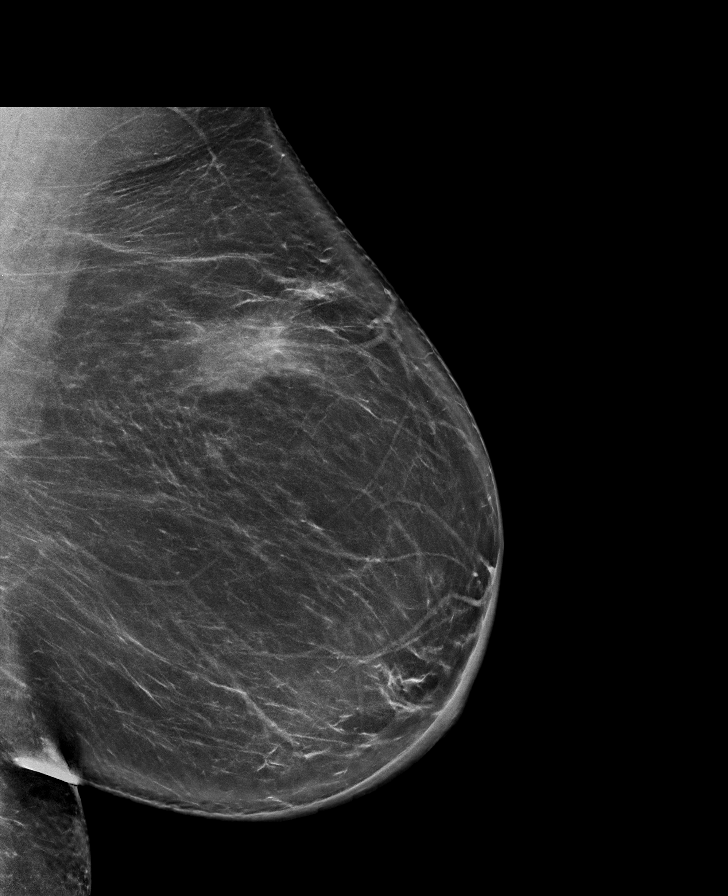

[R MLO]
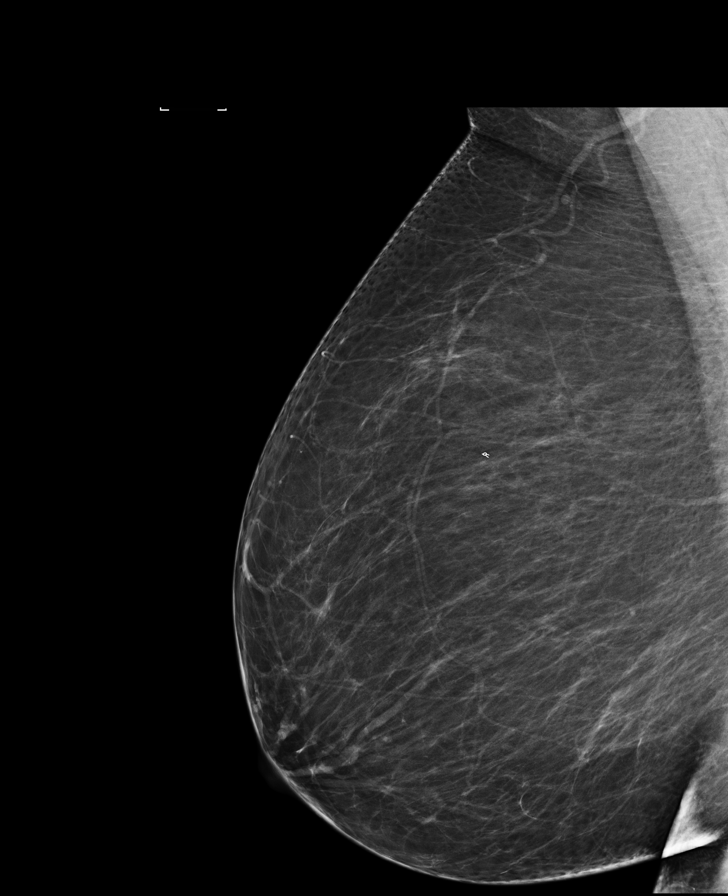

[8 of 29 positions shown; findings below may reference images not displayed]

ACR Breast Density Category b: There are scattered areas of
fibroglandular density.
FINDINGS: There are lumpectomy changes in the outer left breast. Slight skin
thickening of the left breast is compatible with prior radiation
therapy. No mass, nonsurgical distortion, or suspicious
microcalcification is identified in either breast to suggest
malignancy.

Mammographic images were processed with CAD.
IMPRESSION: No evidence of malignancy in either breast. Lumpectomy and radiation
changes of the left breast.

RECOMMENDATION:
Diagnostic mammogram is suggested in 1 year. (Code:OA-B-2XC)

I have discussed the findings and recommendations with the patient.
Results were also provided in writing at the conclusion of the
visit. If applicable, a reminder letter will be sent to the patient
regarding the next appointment.

BI-RADS CATEGORY  2: Benign.

## 2018-07-26 NOTE — Telephone Encounter (Signed)
Rx sent to pharmacy   

## 2018-08-22 ENCOUNTER — Ambulatory Visit: Payer: 59 | Admitting: Radiation Oncology

## 2018-08-29 ENCOUNTER — Ambulatory Visit
Admission: RE | Admit: 2018-08-29 | Discharge: 2018-08-29 | Disposition: A | Discharge status: Home / Self Care | Payer: 59 | Source: Ambulatory Visit | Attending: Radiation Oncology | Admitting: Radiation Oncology

## 2018-08-29 ENCOUNTER — Other Ambulatory Visit: Payer: Self-pay

## 2018-08-29 ENCOUNTER — Encounter: Payer: Self-pay | Admitting: Radiation Oncology

## 2018-08-29 VITALS — BP 137/66 | HR 79 | Temp 97.0°F | Resp 16 | Wt 193.7 lb

## 2018-08-29 DIAGNOSIS — Z853 Personal history of malignant neoplasm of breast: Secondary | ICD-10-CM | POA: Diagnosis not present

## 2018-08-29 DIAGNOSIS — Z923 Personal history of irradiation: Secondary | ICD-10-CM | POA: Diagnosis not present

## 2018-08-29 DIAGNOSIS — Z171 Estrogen receptor negative status [ER-]: Secondary | ICD-10-CM

## 2018-08-29 DIAGNOSIS — C50412 Malignant neoplasm of upper-outer quadrant of left female breast: Secondary | ICD-10-CM

## 2018-08-29 NOTE — Progress Notes (Signed)
Radiation Oncology Follow up Note  Name: Theresa Acosta   Date:   08/29/2018 MRN:  [id number] DOB: 12/25/1950     REFERRING PROVIDER: Birdie Sons, MD  HPI: This 67 y.o. female presents to the clinic today for to year follow-up status post accelerated partial breast radiation to her left breast for stage I ER/PR negative invasive mammary carcinoma.mammograms back in May which I have reviewed were BI-RADS 2 benign. She is not on antiestrogen therapy based on her ER/PR status. .  COMPLICATIONS OF TREATMENT: none  FOLLOW UP COMPLIANCE: keeps appointments   PHYSICAL EXAM:  BP 137/66 (BP Location: Left Arm, Patient Position: Sitting)   Pulse 79   Temp (!) 97 F (36.1 C) (Tympanic)   Resp 16   Wt 193 lb 10.8 oz (87.9 kg)   BMI 33.24 kg/m  Lungs are clear to A&P cardiac examination essentially unremarkable with regular rate and rhythm. No dominant mass or nodularity is noted in either breast in 2 positions examined. Incision is well-healed. No axillary or supraclavicular adenopathy is appreciated. Cosmetic result is excellent.Well-developed well-nourished patient in NAD. HEENT reveals PERLA, EOMI, discs not visualized.  Oral cavity is clear. No oral mucosal lesions are identified. Neck is clear without evidence of cervical or supraclavicular adenopathy. Lungs are clear to A&P. Cardiac examination is essentially unremarkable with regular rate and rhythm without murmur rub or thrill. Abdomen is benign with no organomegaly or masses noted. Motor sensory and DTR levels are equal and symmetric in the upper and lower extremities. Cranial nerves II through XII are grossly intact. Proprioception is intact. No peripheral adenopathy or edema is identified. No motor or sensory levels are noted. Crude visual fields are within normal range.  RADIOLOGY RESULTS: mammograms reviewed compatible with the above-stated findings  PLAN: at the present time patient is doing well with no evidence of disease.  I'm please were overall progress. I've asked to see her back in 1 year for follow-up. She continues close follow-up care with Dr. Tollie Pizza. Patient is to call with any concerns.  I would like to take this opportunity to thank you for allowing me to participate in the care of your patient.Noreene Filbert, MD

## 2018-09-09 ENCOUNTER — Ambulatory Visit (INDEPENDENT_AMBULATORY_CARE_PROVIDER_SITE_OTHER): Payer: 59 | Admitting: Family Medicine

## 2018-09-09 DIAGNOSIS — Z23 Encounter for immunization: Secondary | ICD-10-CM | POA: Diagnosis not present

## 2018-09-09 NOTE — Progress Notes (Signed)
Nurse visit only. Administered 2nd dose of Shingrix vaccine.  

## 2018-09-17 DIAGNOSIS — H524 Presbyopia: Secondary | ICD-10-CM | POA: Diagnosis not present

## 2018-10-05 ENCOUNTER — Other Ambulatory Visit: Payer: Self-pay | Admitting: Nurse Practitioner

## 2018-11-29 ENCOUNTER — Ambulatory Visit (INDEPENDENT_AMBULATORY_CARE_PROVIDER_SITE_OTHER): Payer: 59 | Admitting: Physician Assistant

## 2018-11-29 ENCOUNTER — Encounter: Payer: Self-pay | Admitting: Physician Assistant

## 2018-11-29 VITALS — BP 137/78 | HR 87 | Temp 98.0°F | Resp 16 | Wt 196.0 lb

## 2018-11-29 DIAGNOSIS — J0140 Acute pansinusitis, unspecified: Secondary | ICD-10-CM

## 2018-11-29 DIAGNOSIS — Z6833 Body mass index (BMI) 33.0-33.9, adult: Secondary | ICD-10-CM | POA: Diagnosis not present

## 2018-11-29 DIAGNOSIS — Z713 Dietary counseling and surveillance: Secondary | ICD-10-CM | POA: Diagnosis not present

## 2018-11-29 MED ORDER — PHENTERMINE HCL 37.5 MG PO TABS: 37.5000 mg | ORAL_TABLET | Freq: Every day | ORAL | 2 refills | Status: DC

## 2018-11-29 MED ORDER — AZITHROMYCIN 250 MG PO TABS: ORAL_TABLET | ORAL | 0 refills | Status: DC

## 2018-11-29 NOTE — Progress Notes (Signed)
Patient: Theresa Acosta Female    DOB: Aug 25, 1951   68 y.o.   MRN: [id number] Visit Date: 11/29/2018  Today's Provider: Mar Daring, PA-C   Chief Complaint  Patient presents with  . Cough   Subjective:     Cough  The cough is productive of sputum. Associated symptoms include chills, headaches, myalgias, a sore throat, shortness of breath and wheezing. Pertinent negatives include no chest pain or fever. Treatments tried: Mucinex [id number]. The treatment provided mild relief.   Patient has had sinus pressure and cough for 1 1/2 weeks. Cough is productive. Patient also has symptoms of chills, headaches, chest soreness, shortness of breath of breath and wheezing. Patient has been taking Mucinex with only mild relief.  Patient also desires to discuss weight loss. She has been trying to eat healthier and has been walking daily. She reports she has not lost any weight and would like to discuss medications to "give me a boost."   No Known Allergies   Current Outpatient Medications:  .  alendronate (FOSAMAX) 70 MG tablet, Take 1 tablet (70 mg total) by mouth every 7 (seven) days. Take with a full glass of water on an empty stomach., Disp: 4 tablet, Rfl: 12 .  Cholecalciferol (VITAMIN D3) 1000 units CAPS, Take 1 capsule by mouth once a week., Disp: , Rfl:  .  loratadine (CLARITIN) 10 MG tablet, Take 10 mg by mouth daily., Disp: , Rfl:   Review of Systems  Constitutional: Positive for chills. Negative for appetite change, fatigue and fever.  HENT: Positive for congestion, sinus pressure and sore throat.   Respiratory: Positive for cough, chest tightness, shortness of breath and wheezing.   Cardiovascular: Negative for chest pain and palpitations.  Gastrointestinal: Negative for abdominal pain, nausea and vomiting.  Musculoskeletal: Positive for myalgias.  Neurological: Positive for headaches. Negative for dizziness and weakness.    Social History   Tobacco Use  .  Smoking status: Former Smoker    Packs/day: 0.50    Years: 30.00    Pack years: 15.00    Last attempt to quit: 02/27/2016    Years since quitting: 2.7  . Smokeless tobacco: Never Used  . Tobacco comment: 1/2 ppd since age 24 quit age 54  Substance Use Topics  . Alcohol use: No    Alcohol/week: 0.0 standard drinks      Objective:   BP 137/78 (BP Location: Left Arm, Patient Position: Sitting, Cuff Size: Large)   Pulse 87   Temp 98 F (36.7 C) (Oral)   Resp 16   Wt 196 lb (88.9 kg)   SpO2 97%   BMI 33.64 kg/m  Vitals:   11/29/18 1705  BP: 137/78  Pulse: 87  Resp: 16  Temp: 98 F (36.7 C)  TempSrc: Oral  SpO2: 97%  Weight: 196 lb (88.9 kg)     Physical Exam Vitals signs reviewed.  Constitutional:      General: She is not in acute distress.    Appearance: She is well-developed. She is not diaphoretic.  HENT:     Head: Normocephalic and atraumatic.     Right Ear: Hearing, tympanic membrane, ear canal and external ear normal.     Left Ear: Hearing, tympanic membrane, ear canal and external ear normal.     Nose:     Right Sinus: Maxillary sinus tenderness and frontal sinus tenderness present.     Left Sinus: Maxillary sinus tenderness and frontal sinus tenderness present.  Mouth/Throat:     Pharynx: Uvula midline. No oropharyngeal exudate.  Neck:     Musculoskeletal: Normal range of motion and neck supple.     Thyroid: No thyromegaly.     Trachea: No tracheal deviation.  Cardiovascular:     Rate and Rhythm: Normal rate and regular rhythm.     Heart sounds: Normal heart sounds. No murmur. No friction rub. No gallop.   Pulmonary:     Effort: Pulmonary effort is normal. No respiratory distress.     Breath sounds: No stridor. Wheezing (fine, expiratory throughout) present. No rales.  Lymphadenopathy:     Cervical: No cervical adenopathy.         Assessment & Plan    1. Acute non-recurrent pansinusitis Worsening symptoms that have not responded to OTC  medications. Will give Zpak as below. Continue allergy medications. Stay well hydrated and get plenty of rest. Call if no symptom improvement or if symptoms worsen. - azithromycin (ZITHROMAX) 250 MG tablet; Take 2 tablets PO on day one, and one tablet PO daily thereafter until completed.  Dispense: 6 tablet; Refill: 0  2. Encounter for weight loss counseling Patient has been trying to lose weight with dietary changes and exercise. Will start phentermine as below. She needs to start a food diary as well, discussed myfitnesspal or loseit, and try to stay between 1200-1300 calories daily. She is to return in 3 months for weight recheck. - phentermine (ADIPEX-P) 37.5 MG tablet; Take 1 tablet (37.5 mg total) by mouth daily before breakfast.  Dispense: 30 tablet; Refill: 2  3. BMI 33.0-33.9,adult See above medical treatment plan. - phentermine (ADIPEX-P) 37.5 MG tablet; Take 1 tablet (37.5 mg total) by mouth daily before breakfast.  Dispense: 30 tablet; Refill: [place], PA-C  [facility]

## 2018-12-02 ENCOUNTER — Encounter: Payer: Self-pay | Admitting: Physician Assistant

## 2018-12-04 ENCOUNTER — Telehealth: Payer: Self-pay | Admitting: Physician Assistant

## 2018-12-04 NOTE — Telephone Encounter (Signed)
pt called saying she wants to discuss a medication with a nurse.  Pt would not elaborate.  CB#  [phone]  thanks teri

## 2018-12-04 NOTE — Telephone Encounter (Signed)
Returned call to pt.

## 2019-04-08 ENCOUNTER — Other Ambulatory Visit: Payer: Self-pay

## 2019-04-08 ENCOUNTER — Telehealth: Payer: Self-pay | Admitting: *Deleted

## 2019-04-08 DIAGNOSIS — C50412 Malignant neoplasm of upper-outer quadrant of left female breast: Secondary | ICD-10-CM

## 2019-04-08 DIAGNOSIS — Z171 Estrogen receptor negative status [ER-]: Secondary | ICD-10-CM

## 2019-04-08 NOTE — Telephone Encounter (Signed)
Patient needs to be schedule for a diagnostic mammogram. Please call and advise

## 2019-04-21 ENCOUNTER — Ambulatory Visit
Admission: RE | Admit: 2019-04-21 | Discharge: 2019-04-21 | Disposition: A | Discharge status: Home / Self Care | Payer: 59 | Source: Ambulatory Visit | Attending: General Surgery | Admitting: General Surgery

## 2019-04-21 ENCOUNTER — Other Ambulatory Visit: Payer: Self-pay

## 2019-04-21 DIAGNOSIS — Z171 Estrogen receptor negative status [ER-]: Secondary | ICD-10-CM | POA: Insufficient documentation

## 2019-04-21 DIAGNOSIS — Z853 Personal history of malignant neoplasm of breast: Secondary | ICD-10-CM | POA: Diagnosis not present

## 2019-04-21 DIAGNOSIS — C50412 Malignant neoplasm of upper-outer quadrant of left female breast: Secondary | ICD-10-CM | POA: Insufficient documentation

## 2019-04-21 DIAGNOSIS — R928 Other abnormal and inconclusive findings on diagnostic imaging of breast: Secondary | ICD-10-CM | POA: Diagnosis not present

## 2019-04-21 DIAGNOSIS — N6489 Other specified disorders of breast: Secondary | ICD-10-CM | POA: Diagnosis not present

## 2019-04-21 DIAGNOSIS — Z08 Encounter for follow-up examination after completed treatment for malignant neoplasm: Secondary | ICD-10-CM | POA: Diagnosis not present

## 2019-04-22 ENCOUNTER — Other Ambulatory Visit: Payer: Self-pay

## 2019-04-22 ENCOUNTER — Encounter: Payer: Self-pay | Admitting: General Surgery

## 2019-04-22 ENCOUNTER — Ambulatory Visit (INDEPENDENT_AMBULATORY_CARE_PROVIDER_SITE_OTHER): Payer: 59 | Admitting: General Surgery

## 2019-04-22 VITALS — BP 137/83 | HR 82 | Temp 97.9°F | Resp 16 | Ht 64.0 in | Wt 195.0 lb

## 2019-04-22 DIAGNOSIS — Z171 Estrogen receptor negative status [ER-]: Secondary | ICD-10-CM | POA: Diagnosis not present

## 2019-04-22 DIAGNOSIS — C50412 Malignant neoplasm of upper-outer quadrant of left female breast: Secondary | ICD-10-CM | POA: Diagnosis not present

## 2019-04-22 NOTE — Patient Instructions (Signed)
The patient is aware to call back for any questions or new concerns. Patient will be asked to return to the office in one year with a bilateral screening mammogram.  

## 2019-04-22 NOTE — Progress Notes (Signed)
Patient ID: Theresa Acosta, female   DOB: 06/10/1951, 68 y.o.   MRN: [id number]  Chief Complaint  Patient presents with  . Follow-up    HPI Theresa Acosta is a 68 y.o. female.  Here for her follow up left breast cancer and mammogram and ultrasound on 04-21-19. She has noticed some left breast tenderness and knot. She admits to the grandchildren pressing the breast area with their elbow.  HPI  Past Medical History:  Diagnosis Date  . Breast cancer ([place]) 04/2016   left breast  . 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   . Cancer ([place])   . 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 Personal history of chemotherapy 2017   left breast cancer  . Personal history of colonic polyps 2007  . Personal history of radiation therapy 2017   left breast cancer mammosite    Past Surgical History:  Procedure Laterality Date  . BREAST BIOPSY Left 04/12/16   MAMMARY CARCINOMA WITH HIGH NUCLEAR GRADE AND PAPILLARY FEATURES.   Marland Kitchen BREAST BIOPSY Right 05/09/2016   neg  . BREAST LUMPECTOMY Left 05/26/2016   [facility], DCIS, clear margins, neg LN  . BREAST LUMPECTOMY WITH NEEDLE LOCALIZATION Left 05/26/2016   Procedure: BREAST LUMPECTOMY WITH SENTINEL LYMPH NODE BX and needle localization;  Surgeon: Theresa Bellow, MD;  Location: [facility] ORS;  Service: General;  Laterality: Left;  . BREAST MAMMOSITE Left 06/26/2016  . BUNIONECTOMY  2012  . COLONOSCOPY WITH PROPOFOL N/A 03/21/2016   Procedure: COLONOSCOPY WITH PROPOFOL;  Surgeon: Theresa Bellow, MD;  Location[facility]COPY;  Service: Endoscopy;  Laterality: N/A;  . SENTINEL NODE BIOPSY Left 05/26/2016   Procedure: SENTINEL NODE BIOPSY;  Surgeon: Theresa Bellow, MD;  Location: [facility] ORS;  Service: General;  Laterality: Left;  . TUBAL LIGATION  1977    Family History  Problem Relation Age of Onset  . Healthy Sister   .  Healthy Brother   . Healthy Sister   . Healthy Brother   . Healthy Brother   . Cancer Neg Hx   . Breast cancer Neg Hx     Social History Social History   Tobacco Use  . Smoking status: Former Smoker    Packs/day: 0.50    Years: 30.00    Pack years: 15.00    Quit date: 02/27/2016    Years since quitting: 3.1  . Smokeless tobacco: Never Used  . Tobacco comment: 1/2 ppd since age 28 quit age 66  Substance Use Topics  . Alcohol use: No    Alcohol/week: 0.0 standard drinks  . Drug use: No    No Known Allergies  Current Outpatient Medications  Medication Sig Dispense Refill  . alendronate (FOSAMAX) 70 MG tablet Take 1 tablet (70 mg total) by mouth every 7 (seven) days. Take with a full glass of water on an empty stomach. 4 tablet 12  . Cholecalciferol (VITAMIN D3) 1000 units CAPS Take 1 capsule by mouth once a week.    . loratadine (CLARITIN) 10 MG tablet Take 10 mg by mouth daily.     No current facility-administered medications for this visit.     Review of Systems Review of Systems  Constitutional: Negative.   Respiratory: Negative.   Cardiovascular: Negative.     Blood pressure 137/83, pulse 82, temperature 97.9 F (36.6 C), temperature source Temporal, resp. rate 16, height  5\' 4"  (1.626 m), weight 195 lb (88.5 kg), SpO2 96 %.  Physical Exam Physical Exam Exam conducted with a chaperone present.  Constitutional:      Appearance: She is well-developed.  Eyes:     General: No scleral icterus.    Conjunctiva/sclera: Conjunctivae normal.  Neck:     Musculoskeletal: Neck supple.  Cardiovascular:     Rate and Rhythm: Normal rate and regular rhythm.     Heart sounds: Normal heart sounds.  Pulmonary:     Effort: Pulmonary effort is normal.     Breath sounds: Normal breath sounds.  Chest:     Breasts:        Right: Normal.        Comments: Scarring lateral left breast  Lymphadenopathy:     Cervical: No cervical adenopathy.     Upper Body:     Right upper  body: No supraclavicular or axillary adenopathy.     Left upper body: No supraclavicular or axillary adenopathy.  Skin:    General: Skin is warm and dry.  Neurological:     Mental Status: She is alert and oriented to person, place, and time.  Psychiatric:        Behavior: Behavior normal.     Data Reviewed Bilateral diagnostic mammograms of April 21, 2019 were independently reviewed.  BI-RADS-2.  Assessment No evidence of recurrent breast cancer.  Plan  Patient will be asked to return to the office in one year with a bilateral screening mammogram. The patient is aware to call back for any questions or new concerns.   HPI, assessment, plan and physical exam has been scribed under the direction and in the presence of Theresa Bellow, MD. Theresa Fetch, RN  I have completed the exam and reviewed the above documentation for accuracy and completeness.  I agree with the above.  Theresa Acosta has been used and any errors in dictation or transcription are unintentional.  Theresa Acosta, M.D., F.A.C.S.  Theresa Acosta 04/23/2019, 8:22 PM

## 2019-05-09 DIAGNOSIS — G4733 Obstructive sleep apnea (adult) (pediatric): Secondary | ICD-10-CM | POA: Diagnosis not present

## 2019-05-30 ENCOUNTER — Encounter: Payer: Self-pay | Admitting: General Surgery

## 2019-06-06 ENCOUNTER — Telehealth: Payer: Self-pay | Admitting: *Deleted

## 2019-06-06 NOTE — Telephone Encounter (Signed)
Patient has been notified that lung cancer screening CT scan is due currently or will be in near future. Confirmed that patient is within the appropriate age range, and asymptomatic, (no signs or symptoms of lung cancer). Patient denies illness that would prevent curative treatment for lung cancer if found. Verified smoking history (Former Smoker has not smoked in approximately 3 years). Patient is agreeable for CT scan being scheduled, she will need the appointment after 3pm.

## 2019-06-10 ENCOUNTER — Other Ambulatory Visit: Payer: Self-pay

## 2019-06-10 ENCOUNTER — Other Ambulatory Visit: Payer: Self-pay | Admitting: Podiatry

## 2019-06-10 ENCOUNTER — Ambulatory Visit (INDEPENDENT_AMBULATORY_CARE_PROVIDER_SITE_OTHER): Payer: 59 | Admitting: Podiatry

## 2019-06-10 ENCOUNTER — Encounter: Payer: Self-pay | Admitting: Podiatry

## 2019-06-10 ENCOUNTER — Other Ambulatory Visit: Payer: Self-pay | Admitting: *Deleted

## 2019-06-10 ENCOUNTER — Ambulatory Visit (INDEPENDENT_AMBULATORY_CARE_PROVIDER_SITE_OTHER): Payer: 59

## 2019-06-10 VITALS — Temp 97.2°F

## 2019-06-10 DIAGNOSIS — S90852A Superficial foreign body, left foot, initial encounter: Secondary | ICD-10-CM | POA: Diagnosis not present

## 2019-06-10 DIAGNOSIS — M779 Enthesopathy, unspecified: Secondary | ICD-10-CM

## 2019-06-10 DIAGNOSIS — M795 Residual foreign body in soft tissue: Secondary | ICD-10-CM

## 2019-06-10 DIAGNOSIS — Z87891 Personal history of nicotine dependence: Secondary | ICD-10-CM

## 2019-06-10 DIAGNOSIS — Z122 Encounter for screening for malignant neoplasm of respiratory organs: Secondary | ICD-10-CM

## 2019-06-13 ENCOUNTER — Other Ambulatory Visit: Payer: Self-pay

## 2019-06-13 ENCOUNTER — Ambulatory Visit
Admission: RE | Admit: 2019-06-13 | Discharge: 2019-06-13 | Disposition: A | Discharge status: Home / Self Care | Payer: 59 | Source: Ambulatory Visit | Attending: Oncology | Admitting: Oncology

## 2019-06-13 DIAGNOSIS — Z122 Encounter for screening for malignant neoplasm of respiratory organs: Secondary | ICD-10-CM | POA: Insufficient documentation

## 2019-06-13 DIAGNOSIS — Z87891 Personal history of nicotine dependence: Secondary | ICD-10-CM | POA: Insufficient documentation

## 2019-06-13 NOTE — Progress Notes (Signed)
   HPI: 68 y.o. female presenting today with a chief complaint of a painful area of the plantar aspect of the lateral left foot that began about one month ago secondary to stepping on a piece of glass. Walking and bearing weight increases the pain. She denies any bleeding or drainage. She has not had any treatment for the symptoms. Patient is here for further evaluation and treatment.   Past Medical History:  Diagnosis Date  . Breast cancer ([place]) 04/2016   left breast  . 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   . Cancer ([place])   . 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 Personal history of chemotherapy 2017   left breast cancer  . Personal history of colonic polyps 2007  . Personal history of radiation therapy 2017   left breast cancer mammosite     Physical Exam: General: The patient is alert and oriented x3 in no acute distress.  Dermatology: Foreign body noted to the left foot with hyperkeratotic callus lesion. Skin is warm, dry and supple bilateral lower extremities.   Vascular: Palpable pedal pulses bilaterally. No edema or erythema noted. Capillary refill within normal limits.  Neurological: Epicritic and protective threshold grossly intact bilaterally.   Musculoskeletal Exam: Range of motion within normal limits to all pedal and ankle joints bilateral. Muscle strength 5/5 in all groups bilateral.   Radiographic Exam:  Normal osseous mineralization. Joint spaces preserved. No fracture/dislocation/boney destruction.    Assessment: 1. Foreign body (glass shard) left foot    Plan of Care:  1. Patient evaluated. X-Rays reviewed.  2. Debridement and removal of foreign body performed with a tissue nipper.  3. No anesthesia utilized.  4. Return to clinic as needed.       Edrick Kins, DPM [facility]  Dr. Edrick Kins,  DPM    [address], [place] [address]                Office [phone]  Fax [phone]

## 2019-06-20 ENCOUNTER — Encounter: Payer: Self-pay | Admitting: *Deleted

## 2019-07-04 ENCOUNTER — Encounter: Payer: Self-pay | Admitting: *Deleted

## 2019-09-03 ENCOUNTER — Ambulatory Visit: Payer: 59 | Admitting: Radiation Oncology

## 2019-09-10 ENCOUNTER — Other Ambulatory Visit: Payer: Self-pay

## 2019-09-11 ENCOUNTER — Ambulatory Visit
Admission: RE | Admit: 2019-09-11 | Discharge: 2019-09-11 | Disposition: A | Discharge status: Home / Self Care | Payer: 59 | Source: Ambulatory Visit | Attending: Radiation Oncology | Admitting: Radiation Oncology

## 2019-09-11 ENCOUNTER — Encounter: Payer: Self-pay | Admitting: Radiation Oncology

## 2019-09-11 ENCOUNTER — Other Ambulatory Visit: Payer: Self-pay

## 2019-09-11 VITALS — BP 160/61 | HR 77 | Temp 98.0°F | Resp 18 | Wt 196.3 lb

## 2019-09-11 DIAGNOSIS — Z923 Personal history of irradiation: Secondary | ICD-10-CM | POA: Insufficient documentation

## 2019-09-11 DIAGNOSIS — C50412 Malignant neoplasm of upper-outer quadrant of left female breast: Secondary | ICD-10-CM

## 2019-09-11 DIAGNOSIS — Z853 Personal history of malignant neoplasm of breast: Secondary | ICD-10-CM | POA: Diagnosis not present

## 2019-09-11 NOTE — Progress Notes (Signed)
Radiation Oncology Follow up Note  Name: Theresa Acosta   Date:   09/11/2019 MRN:  [id number] DOB: [birth year]    This 68 y.o. 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  Seen today in routine follow-up she is doing well.  She specifically denies breast tenderness cough or bone pain.  She is not on antiestrogen therapy based on the negative ER/PR nature of her disease..  She had ultrasound and mammograms back in June which I have reviewed were BI-RADS 2 benign.  COMPLICATIONS OF TREATMENT: none  FOLLOW UP COMPLIANCE: keeps appointments   PHYSICAL EXAM:  BP (!) 160/61   Pulse 77   Temp 98 F (36.7 C)   Resp 18   Wt 196 lb 4.8 oz (89 kg)   BMI 33.69 kg/m  Lungs are clear to A&P cardiac examination essentially unremarkable with regular rate and rhythm. No dominant mass or nodularity is noted in either breast in 2 positions examined. Incision is well-healed. No axillary or supraclavicular adenopathy is appreciated. Cosmetic result is excellent.  Well-developed well-nourished patient in NAD. HEENT reveals PERLA, EOMI, discs not visualized.  Oral cavity is clear. No oral mucosal lesions are identified. Neck is clear without evidence of cervical or supraclavicular adenopathy. Lungs are clear to A&P. Cardiac examination is essentially unremarkable with regular rate and rhythm without murmur rub or thrill. Abdomen is benign with no organomegaly or masses noted. Motor sensory and DTR levels are equal and symmetric in the upper and lower extremities. Cranial nerves II through XII are grossly intact. Proprioception is intact. No peripheral  adenopathy or edema is identified. No motor or sensory levels are noted. Crude visual fields are within normal range.  RADIOLOGY RESULTS: Mammograms and ultrasound reviewed compatible with above-stated findings  PLAN: Present time patient is doing well with no evidence of disease 3 years out.  I am pleased with her overall progress.  I have asked to see her back in 1 year for follow-up and then I will discontinue follow-up care.  Patient is comfortable with my recommendations.  I would like to take this opportunity to thank you for allowing me to participate in the care of your patient.Noreene Filbert, MD

## 2020-03-02 ENCOUNTER — Other Ambulatory Visit: Payer: Self-pay

## 2020-03-02 DIAGNOSIS — Z1231 Encounter for screening mammogram for malignant neoplasm of breast: Secondary | ICD-10-CM

## 2020-03-03 ENCOUNTER — Other Ambulatory Visit: Payer: Self-pay | Admitting: General Surgery

## 2020-03-03 DIAGNOSIS — C50412 Malignant neoplasm of upper-outer quadrant of left female breast: Secondary | ICD-10-CM

## 2020-03-11 NOTE — Addendum Note (Signed)
Addended byBary Castilla, Nyzaiah Kai on: 03/11/2020 11:21 AM   Modules accepted: Orders

## 2020-03-17 ENCOUNTER — Other Ambulatory Visit: Payer: Self-pay | Admitting: General Surgery

## 2020-03-17 DIAGNOSIS — C50412 Malignant neoplasm of upper-outer quadrant of left female breast: Secondary | ICD-10-CM

## 2020-04-22 ENCOUNTER — Ambulatory Visit
Admission: RE | Admit: 2020-04-22 | Discharge: 2020-04-22 | Disposition: A | Discharge status: Home / Self Care | Payer: 59 | Source: Ambulatory Visit | Attending: General Surgery | Admitting: General Surgery

## 2020-04-22 DIAGNOSIS — Z853 Personal history of malignant neoplasm of breast: Secondary | ICD-10-CM | POA: Diagnosis not present

## 2020-04-22 DIAGNOSIS — C50412 Malignant neoplasm of upper-outer quadrant of left female breast: Secondary | ICD-10-CM | POA: Insufficient documentation

## 2020-04-22 DIAGNOSIS — R928 Other abnormal and inconclusive findings on diagnostic imaging of breast: Secondary | ICD-10-CM | POA: Diagnosis not present

## 2020-04-22 DIAGNOSIS — Z171 Estrogen receptor negative status [ER-]: Secondary | ICD-10-CM | POA: Diagnosis not present

## 2020-05-06 DIAGNOSIS — Z170 Estrogen receptor positive status [ER+]: Secondary | ICD-10-CM | POA: Diagnosis not present

## 2020-05-06 DIAGNOSIS — C50412 Malignant neoplasm of upper-outer quadrant of left female breast: Secondary | ICD-10-CM | POA: Diagnosis not present

## 2020-06-01 ENCOUNTER — Telehealth: Payer: Self-pay | Admitting: *Deleted

## 2020-06-01 NOTE — Telephone Encounter (Signed)
Left message for patient to notify them that it is time to schedule annual low dose lung cancer screening CT scan. Instructed patient to call back to verify information prior to the scan being scheduled.  

## 2020-06-06 ENCOUNTER — Telehealth: Payer: Self-pay

## 2020-06-06 NOTE — Telephone Encounter (Signed)
Contacted patient to schedule her annual lung screening CT scan.  Left message for patient to call Burgess Estelle, lung navigator to schedule.

## 2020-06-25 ENCOUNTER — Telehealth: Payer: Self-pay | Admitting: *Deleted

## 2020-06-25 NOTE — Telephone Encounter (Signed)
I called this patient twice leaving a Voice mail. I explained I was calling to do her Lung Cancer Screening and that someone would call her back.

## 2020-07-10 ENCOUNTER — Telehealth: Payer: Self-pay

## 2020-07-10 DIAGNOSIS — Z87891 Personal history of nicotine dependence: Secondary | ICD-10-CM

## 2020-07-10 DIAGNOSIS — Z122 Encounter for screening for malignant neoplasm of respiratory organs: Secondary | ICD-10-CM

## 2020-07-10 NOTE — Telephone Encounter (Signed)
Patient notified that lung screening imaging is due currently or in the near future. Patients preference is for 07/15/20 @ 2:45 .  Patient is a former smoker.

## 2020-07-12 NOTE — Telephone Encounter (Signed)
Former smoker, quit 02/27/16, 60 pack year

## 2020-07-15 ENCOUNTER — Ambulatory Visit
Admission: RE | Admit: 2020-07-15 | Discharge: 2020-07-15 | Disposition: A | Discharge status: Home / Self Care | Payer: 59 | Source: Ambulatory Visit | Attending: Nurse Practitioner | Admitting: Nurse Practitioner

## 2020-07-15 ENCOUNTER — Other Ambulatory Visit: Payer: Self-pay

## 2020-07-15 DIAGNOSIS — Z87891 Personal history of nicotine dependence: Secondary | ICD-10-CM | POA: Diagnosis not present

## 2020-07-15 DIAGNOSIS — Z122 Encounter for screening for malignant neoplasm of respiratory organs: Secondary | ICD-10-CM | POA: Insufficient documentation

## 2020-07-20 ENCOUNTER — Encounter: Payer: Self-pay | Admitting: *Deleted

## 2020-08-31 NOTE — Progress Notes (Signed)
Patient had sore throat and had to be rescheduled.

## 2020-09-01 ENCOUNTER — Ambulatory Visit (INDEPENDENT_AMBULATORY_CARE_PROVIDER_SITE_OTHER): Payer: 59 | Admitting: Physician Assistant

## 2020-09-01 ENCOUNTER — Other Ambulatory Visit: Payer: Self-pay

## 2020-09-01 NOTE — Patient Instructions (Signed)
Preventive Care 68 Years and Older, Female Preventive care refers to lifestyle choices and visits with your health care provider that can promote health and wellness. This includes:  A yearly physical exam. This is also called an annual well check.  Regular dental and eye exams.  Immunizations.  Screening for certain conditions.  Healthy lifestyle choices, such as diet and exercise. What can I expect for my preventive care visit? Physical exam Your health care provider will check:  Height and weight. These may be used to calculate body mass index (BMI), which is a measurement that tells if you are at a healthy weight.  Heart rate and blood pressure.  Your skin for abnormal spots. Counseling Your health care provider may ask you questions about:  Alcohol, tobacco, and drug use.  Emotional well-being.  Home and relationship well-being.  Sexual activity.  Eating habits.  History of falls.  Memory and ability to understand (cognition).  Work and work Statistician.  Pregnancy and menstrual history. What immunizations do I need?  Influenza (flu) vaccine  This is recommended every year. Tetanus, diphtheria, and pertussis (Tdap) vaccine  You may need a Td booster every 10 years. Varicella (chickenpox) vaccine  You may need this vaccine if you have not already been vaccinated. Zoster (shingles) vaccine  You may need this after age 33. Pneumococcal conjugate (PCV13) vaccine  One dose is recommended after age 33. Pneumococcal polysaccharide (PPSV23) vaccine  One dose is recommended after age 72. Measles, mumps, and rubella (MMR) vaccine  You may need at least one dose of MMR if you were born in 1957 or later. You may also need a second dose. Meningococcal conjugate (MenACWY) vaccine  You may need this if you have certain conditions. Hepatitis A vaccine  You may need this if you have certain conditions or if you travel or work in places where you may be exposed  to hepatitis A. Hepatitis B vaccine  You may need this if you have certain conditions or if you travel or work in places where you may be exposed to hepatitis B. Haemophilus influenzae type b (Hib) vaccine  You may need this if you have certain conditions. You may receive vaccines as individual doses or as more than one vaccine together in one shot (combination vaccines). Talk with your health care provider about the risks and benefits of combination vaccines. What tests do I need? Blood tests  Lipid and cholesterol levels. These may be checked every 5 years, or more frequently depending on your overall health.  Hepatitis C test.  Hepatitis B test. Screening  Lung cancer screening. You may have this screening every year starting at age 39 if you have a 30-pack-year history of smoking and currently smoke or have quit within the past 15 years.  Colorectal cancer screening. All adults should have this screening starting at age 36 and continuing until age 15. Your health care provider may recommend screening at age 23 if you are at increased risk. You will have tests every 1-10 years, depending on your results and the type of screening test.  Diabetes screening. This is done by checking your blood sugar (glucose) after you have not eaten for a while (fasting). You may have this done every 1-3 years.  Mammogram. This may be done every 1-2 years. Talk with your health care provider about how often you should have regular mammograms.  BRCA-related cancer screening. This may be done if you have a family history of breast, ovarian, tubal, or peritoneal cancers.  Other tests  Sexually transmitted disease (STD) testing.  Bone density scan. This is done to screen for osteoporosis. You may have this done starting at age 44. Follow these instructions at home: Eating and drinking  Eat a diet that includes fresh fruits and vegetables, whole grains, lean protein, and low-fat dairy products. Limit  your intake of foods with high amounts of sugar, saturated fats, and salt.  Take vitamin and mineral supplements as recommended by your health care provider.  Do not drink alcohol if your health care provider tells you not to drink.  If you drink alcohol: ? Limit how much you have to 0-1 drink a day. ? Be aware of how much alcohol is in your drink. In the [place] one drink equals one 12 oz bottle of beer (355 mL), one 5 oz glass of wine (148 mL), or one 1 oz glass of hard liquor (44 mL). Lifestyle  Take daily care of your teeth and gums.  Stay active. Exercise for at least 30 minutes on 5 or more days each week.  Do not use any products that contain nicotine or tobacco, such as cigarettes, e-cigarettes, and chewing tobacco. If you need help quitting, ask your health care provider.  If you are sexually active, practice safe sex. Use a condom or other form of protection in order to prevent STIs (sexually transmitted infections).  Talk with your health care provider about taking a low-dose aspirin or statin. What's next?  Go to your health care provider once a year for a well check visit.  Ask your health care provider how often you should have your eyes and teeth checked.  Stay up to date on all vaccines. This information is not intended to replace advice given to you by your health care provider. Make sure you discuss any questions you have with your health care provider. Document Revised: 10/10/2018 Document Reviewed: 10/10/2018 Elsevier Patient Education  2020 [employer].

## 2020-09-08 ENCOUNTER — Encounter: Payer: Self-pay | Admitting: Radiation Oncology

## 2020-09-09 ENCOUNTER — Encounter: Payer: Self-pay | Admitting: Radiation Oncology

## 2020-09-09 ENCOUNTER — Other Ambulatory Visit: Payer: Self-pay

## 2020-09-09 ENCOUNTER — Ambulatory Visit
Admission: RE | Admit: 2020-09-09 | Discharge: 2020-09-09 | Disposition: A | Discharge status: Home / Self Care | Payer: 59 | Source: Ambulatory Visit | Attending: Radiation Oncology | Admitting: Radiation Oncology

## 2020-09-09 VITALS — BP 140/75 | HR 70 | Temp 97.1°F | Wt 196.0 lb

## 2020-09-09 DIAGNOSIS — Z853 Personal history of malignant neoplasm of breast: Secondary | ICD-10-CM | POA: Diagnosis not present

## 2020-09-09 DIAGNOSIS — C50412 Malignant neoplasm of upper-outer quadrant of left female breast: Secondary | ICD-10-CM

## 2020-09-09 NOTE — Progress Notes (Signed)
Radiation Oncology Follow up Note  Name: Theresa Acosta   Date:   09/09/2020 MRN:  [id number] DOB: [birth year]    This 69 y.o. 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  She specifically denies breast tenderness cough or bone pain.. She had a mammogram back in June which I reviewed was BI-RADS 2 benign.  She is also had a screening CT scan of her chest showing no evidence of disease.  Patient based on the ER/PR negative nature of her disease is not on antiestrogen therapy.  COMPLICATIONS OF TREATMENT: none  FOLLOW UP COMPLIANCE: keeps appointments   PHYSICAL EXAM:  BP 140/75   Pulse 70   Temp (!) 97.1 F (36.2 C) (Tympanic)   Wt 196 lb (88.9 kg)   BMI 33.64 kg/m  Lungs are clear to A&P cardiac examination essentially unremarkable with regular rate and rhythm. No dominant mass or nodularity is noted in either breast in 2 positions examined. Incision is well-healed. No axillary or supraclavicular adenopathy is appreciated. Cosmetic result is excellent.  Well-developed well-nourished patient in NAD. HEENT reveals PERLA, EOMI, discs not visualized.  Oral cavity is clear. No oral mucosal lesions are identified. Neck is clear without evidence of cervical or supraclavicular adenopathy. Lungs are clear to A&P. Cardiac examination is essentially unremarkable with regular rate and rhythm without murmur rub or thrill. Abdomen is benign with no organomegaly or masses noted. Motor sensory and DTR levels are equal and symmetric in the upper and lower extremities. Cranial nerves II through XII  are grossly intact. Proprioception is intact. No peripheral adenopathy or edema is identified. No motor or sensory levels are noted. Crude visual fields are within normal range.  RADIOLOGY RESULTS: CT scan of the chest as well as mammograms reviewed compatible with above-stated findings  PLAN: Present time patient is doing well with no evidence of disease.  Based on the over 4 years I am to turn follow-up care over to Dr. Tollie Pizza.  I be happy to reevaluate the patient in time with any issues.  Patient knows to call with any concerns at any time.  I would like to take this opportunity to thank you for allowing me to participate in the care of your patient.Noreene Filbert, MD

## 2020-10-01 ENCOUNTER — Other Ambulatory Visit: Payer: Self-pay | Admitting: Physician Assistant

## 2020-10-01 ENCOUNTER — Ambulatory Visit (INDEPENDENT_AMBULATORY_CARE_PROVIDER_SITE_OTHER): Payer: 59 | Admitting: Physician Assistant

## 2020-10-01 ENCOUNTER — Encounter: Payer: Self-pay | Admitting: Physician Assistant

## 2020-10-01 ENCOUNTER — Other Ambulatory Visit: Payer: Self-pay

## 2020-10-01 VITALS — BP 139/60 | HR 82 | Temp 98.4°F | Resp 16 | Ht 63.0 in | Wt 193.2 lb

## 2020-10-01 DIAGNOSIS — E6609 Other obesity due to excess calories: Secondary | ICD-10-CM

## 2020-10-01 DIAGNOSIS — G4733 Obstructive sleep apnea (adult) (pediatric): Secondary | ICD-10-CM

## 2020-10-01 DIAGNOSIS — Z6834 Body mass index (BMI) 34.0-34.9, adult: Secondary | ICD-10-CM

## 2020-10-01 DIAGNOSIS — R739 Hyperglycemia, unspecified: Secondary | ICD-10-CM | POA: Diagnosis not present

## 2020-10-01 DIAGNOSIS — E7800 Pure hypercholesterolemia, unspecified: Secondary | ICD-10-CM

## 2020-10-01 DIAGNOSIS — E66811 Obesity, class 1: Secondary | ICD-10-CM

## 2020-10-01 DIAGNOSIS — Z9989 Dependence on other enabling machines and devices: Secondary | ICD-10-CM

## 2020-10-01 DIAGNOSIS — E559 Vitamin D deficiency, unspecified: Secondary | ICD-10-CM | POA: Diagnosis not present

## 2020-10-01 DIAGNOSIS — Z0000 Encounter for general adult medical examination without abnormal findings: Secondary | ICD-10-CM | POA: Diagnosis not present

## 2020-10-01 MED ORDER — PHENTERMINE HCL 37.5 MG PO CAPS: 37.5000 mg | ORAL_CAPSULE | ORAL | 2 refills | Status: DC

## 2020-10-01 NOTE — Progress Notes (Signed)
Complete physical exam   Patient: Theresa Acosta   DOB: [birth year]   68 y.o. Female  MRN: [id number] Visit Date: 10/01/2020  Today's healthcare provider: Mar Daring, PA-C   Chief Complaint  Patient presents with  . Annual Exam   Subjective    FINESSE Acosta is a 69 y.o. female who presents today for a complete physical exam.  She reports consuming a general diet. The patient does not participate in regular exercise at present. She generally feels well. She reports sleeping well. She does not have additional problems to discuss today.  HPI  07/19/18-pap- 03/21/16 Colonoscopy done  Past Medical History:  Diagnosis Date  . Breast cancer ([place]) 04/2016   left breast  . 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   . Cancer ([place])   . 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 Personal history of chemotherapy 2017   left breast cancer  . Personal history of colonic polyps 2007  . Personal history of radiation therapy 2017   left breast cancer mammosite   Past Surgical History:  Procedure Laterality Date  . BREAST BIOPSY Left 04/12/16   MAMMARY CARCINOMA WITH HIGH NUCLEAR GRADE AND PAPILLARY FEATURES.   Theresa Acosta BREAST BIOPSY Right 05/09/2016   neg  . BREAST LUMPECTOMY Left 05/26/2016   [facility], DCIS, clear margins, neg LN  . BREAST LUMPECTOMY WITH NEEDLE LOCALIZATION Left 05/26/2016   Procedure: BREAST LUMPECTOMY WITH SENTINEL LYMPH NODE BX and needle localization;  Surgeon: Robert Bellow, MD;  Location: [facility] ORS;  Service: General;  Laterality: Left;  . BREAST MAMMOSITE Left 06/26/2016  . BUNIONECTOMY  2012  . COLONOSCOPY WITH PROPOFOL N/A 03/21/2016   Procedure: COLONOSCOPY WITH PROPOFOL;  Surgeon: Robert Bellow, MD;  Location: [facility] ENDOSCOPY;  Service: Endoscopy;  Laterality: N/A;  . SENTINEL NODE BIOPSY Left 05/26/2016   Procedure:  SENTINEL NODE BIOPSY;  Surgeon: Robert Bellow, MD;  Location: [facility] ORS;  Service: General;  Laterality: Left;  . TUBAL LIGATION  1977   Social History   Socioeconomic History  . Marital status: Divorced    Spouse name: Not on file  . Number of children: 2  . Years of education: Not on file  . Highest education level: Not on file  Occupational History  . Not on file  Tobacco Use  . Smoking status: Former Smoker    Packs/day: 0.50    Years: 30.00    Pack years: 15.00    Quit date: 02/27/2016    Years since quitting: 4.5  . Smokeless tobacco: Never Used  . Tobacco comment: 1/2 ppd since age 76 quit age 30  Substance and Sexual Activity  . Alcohol use: No    Alcohol/week: 0.0 standard drinks  . Drug use: No  . Sexual activity: Not on file  Other Topics Concern  . Not on file  Social History Narrative  . Not on file   Social Determinants of Health   Financial Resource Strain:   . Difficulty of Paying Living Expenses: Not on file  Food Insecurity:   . Worried About Charity fundraiser in the Last Year: Not on file  . Ran Out of Food in the Last Year: Not on file  Transportation Needs:   . Lack of Transportation (Medical): Not on file  . Lack of Transportation (Non-Medical): Not on file  Physical Activity:   .  Days of Exercise per Week: Not on file  . Minutes of Exercise per Session: Not on file  Stress:   . Feeling of Stress : Not on file  Social Connections:   . Frequency of Communication with Friends and Family: Not on file  . Frequency of Social Gatherings with Friends and Family: Not on file  . Attends Religious Services: Not on file  . Active Member of Clubs or Organizations: Not on file  . Attends Archivist Meetings: Not on file  . Marital Status: Not on file  Intimate Partner Violence:   . Fear of Current or Ex-Partner: Not on file  . Emotionally Abused: Not on file  . Physically Abused: Not on file  . Sexually Abused: Not on file   Family  Status  Relation Name Status  . Father  Deceased at age 53  . Mother  Deceased at age 12       pneumonia  . Sister  Alive  . Brother  Alive  . Sister  Alive  . Brother  Alive  . Brother  Alive  . Neg Hx  (Not Specified)   Family History  Problem Relation Age of Onset  . Healthy Sister   . Healthy Brother   . Healthy Sister   . Healthy Brother   . Healthy Brother   . Cancer Neg Hx   . Breast cancer Neg Hx    No Known Allergies  Patient Care Team: Rubye Beach as PCP - General (Family Medicine) Bary Castilla Forest Gleason, MD (General Surgery)   Medications: Outpatient Medications Prior to Visit  Medication Sig  . Cholecalciferol (VITAMIN D3) 1000 units CAPS Take 1 capsule by mouth once a week.  . loratadine (CLARITIN) 10 MG tablet Take 10 mg by mouth daily.  Theresa Acosta alendronate (FOSAMAX) 70 MG tablet Take 1 tablet (70 mg total) by mouth every 7 (seven) days. Take with a full glass of water on an empty stomach. (Patient not taking: Reported on 06/10/2019)   No facility-administered medications prior to visit.    Review of Systems  Constitutional: Negative.   HENT: Negative.   Eyes: Negative.   Respiratory: Negative.   Cardiovascular: Negative.   Gastrointestinal: Negative.   Endocrine: Negative.   Genitourinary: Negative.   Musculoskeletal: Negative.   Skin: Negative.   Allergic/Immunologic: Negative.   Neurological: Negative.   Hematological: Negative.   Psychiatric/Behavioral: Negative.     Last CBC Lab Results  Component Value Date   WBC 5.7 10/01/2020   HGB 13.0 10/01/2020   HCT 39.9 10/01/2020   MCV 82 10/01/2020   MCH 26.6 10/01/2020   RDW 12.4 10/01/2020   PLT 315 40/98/1191   Last metabolic panel Lab Results  Component Value Date   GLUCOSE 96 10/01/2020   NA 144 10/01/2020   K 4.3 10/01/2020   CL 104 10/01/2020   CO2 24 10/01/2020   BUN 11 10/01/2020   CREATININE 0.79 10/01/2020   GFRNONAA 77 10/01/2020   GFRAA 89 10/01/2020   CALCIUM  9.4 10/01/2020   PROT 6.6 10/01/2020   ALBUMIN 4.1 10/01/2020   LABGLOB 2.5 10/01/2020   AGRATIO 1.6 10/01/2020   BILITOT 0.4 10/01/2020   ALKPHOS 77 10/01/2020   AST 17 10/01/2020   ALT 12 10/01/2020   ANIONGAP 8 12/14/2017      Objective    BP 139/60 (BP Location: Right Arm, Patient Position: Sitting, Cuff Size: Large)   Pulse 82   Temp 98.4 F (36.9 C) (Oral)  Resp 16   Ht 5\' 3"  (1.6 m)   Wt 193 lb 3.2 oz (87.6 kg)   BMI 34.22 kg/m  BP Readings from Last 3 Encounters:  10/01/20 139/60  09/09/20 140/75  09/11/19 (!) 160/61   Wt Readings from Last 3 Encounters:  10/01/20 193 lb 3.2 oz (87.6 kg)  09/09/20 196 lb (88.9 kg)  07/15/20 201 lb (91.2 kg)      Physical Exam Vitals reviewed.  Constitutional:      General: She is not in acute distress.    Appearance: Normal appearance. She is well-developed. She is obese. She is not ill-appearing or diaphoretic.  HENT:     Head: Normocephalic and atraumatic.     Right Ear: Tympanic membrane, ear canal and external ear normal.     Left Ear: Tympanic membrane, ear canal and external ear normal.  Eyes:     General: No scleral icterus.       Right eye: No discharge.        Left eye: No discharge.     Extraocular Movements: Extraocular movements intact.     Conjunctiva/sclera: Conjunctivae normal.     Pupils: Pupils are equal, round, and reactive to light.  Neck:     Thyroid: No thyromegaly.     Vascular: No carotid bruit or JVD.     Trachea: No tracheal deviation.  Cardiovascular:     Rate and Rhythm: Normal rate and regular rhythm.     Pulses: Normal pulses.     Heart sounds: Normal heart sounds. No murmur heard.  No friction rub. No gallop.   Pulmonary:     Effort: Pulmonary effort is normal. No respiratory distress.     Breath sounds: Normal breath sounds. No wheezing or rales.  Chest:     Chest wall: No tenderness.  Abdominal:     General: Abdomen is flat. Bowel sounds are normal. There is no distension.      Palpations: Abdomen is soft. There is no mass.     Tenderness: There is no abdominal tenderness. There is no guarding or rebound.  Musculoskeletal:        General: No tenderness. Normal range of motion.     Cervical back: Normal range of motion and neck supple. No tenderness.     Right lower leg: No edema.     Left lower leg: No edema.  Lymphadenopathy:     Cervical: No cervical adenopathy.  Skin:    General: Skin is warm and dry.     Capillary Refill: Capillary refill takes less than 2 seconds.     Findings: No rash.  Neurological:     General: No focal deficit present.     Mental Status: She is alert and oriented to person, place, and time. Mental status is at baseline.  Psychiatric:        Mood and Affect: Mood normal.        Behavior: Behavior normal.        Thought Content: Thought content normal.        Judgment: Judgment normal.      Last depression screening scores PHQ 2/9 Scores 10/01/2020 07/09/2018 04/02/2017  PHQ - 2 Score 0 0 0  PHQ- 9 Score - 3 6   Last fall risk screening Fall Risk  04/22/2019  Falls in the past year? 0  Follow up Falls evaluation completed   Last Audit-C alcohol use screening Alcohol Use Disorder Test (AUDIT) 10/01/2020  1. How often do you have a  drink containing alcohol? 0  2. How many drinks containing alcohol do you have on a typical day when you are drinking? 0  3. How often do you have six or more drinks on one occasion? 0  AUDIT-C Score 0  Alcohol Brief Interventions/Follow-up AUDIT Score <7 follow-up not indicated   A score of 3 or more in women, and 4 or more in men indicates increased risk for alcohol abuse, EXCEPT if all of the points are from question 1   No results found for any visits on 10/01/20.  Assessment & Plan    Routine Health Maintenance and Physical Exam  Exercise Activities and Dietary recommendations Goals    . Exercise 150 minutes per week (moderate activity)       Immunization History  Administered Date(s)  Administered  . Pneumococcal Conjugate-13 07/09/2018  . Td 01/04/2015  . Tdap 01/04/2015  . Zoster Recombinat (Shingrix) 07/09/2018, 09/09/2018    Health Maintenance  Topic Date Due  . COVID-19 Vaccine (1) Never done  . PNA vac Low Risk Adult (2 of 2 - PPSV23) 07/10/2019  . INFLUENZA VACCINE  Never done  . MAMMOGRAM  04/22/2022  . TETANUS/TDAP  01/03/2025  . COLONOSCOPY  03/21/2026  . DEXA SCAN  Completed  . Hepatitis C Screening  Completed    Discussed health benefits of physical activity, and encouraged her to engage in regular exercise appropriate for her age and condition.  1. Annual physical exam Normal physical exam today. Will check labs as below and f/u pending lab results. If labs are stable and WNL she will not need to have these rechecked for one year at her next annual physical exam. She is to call the office in the meantime if she has any acute issue, questions or concerns. - CBC w/Diff/Platelet - Comprehensive Metabolic Panel (CMET) - TSH  2. OSA on CPAP Stable. Uses CPAP nightly.   3. Hypercholesteremia Diet controlled.  Will check labs as below and f/u pending results. - CBC w/Diff/Platelet - Comprehensive Metabolic Panel (CMET) - Lipid Panel With LDL/HDL Ratio  4. Blood glucose elevated Diet controlled. Will check labs as below and f/u pending results. - CBC w/Diff/Platelet - Comprehensive Metabolic Panel (CMET) - HgB A1c  5. Vitamin D deficiency H/O this and postmenopausal. Will check labs as below and f/u pending results. - CBC w/Diff/Platelet - Vitamin D (25 hydroxy) - Vitamin D, Ergocalciferol, (DRISDOL) 1.25 MG (50000 UNIT) CAPS capsule; Take 1 capsule (50,000 Units total) by mouth every 7 (seven) days.  Dispense: 12 capsule; Refill: 3  6. Class 1 obesity due to excess calories with serious comorbidity and body mass index (BMI) of 34.0 to 34.9 in adult Counseled patient on healthy lifestyle modifications including dieting and exercise.  Will  start Phentermine as below. F/U in 3 months for weight recheck.  - phentermine 37.5 MG capsule; Take 1 capsule (37.5 mg total) by mouth every morning.  Dispense: 30 capsule; Refill: 2   No follow-ups on file.     Reynolds Bowl, PA-C, have reviewed all documentation for this visit. The documentation on 10/05/20 for the exam, diagnosis, procedures, and orders are all accurate and complete.   Rubye Beach  [facility] [phone] (phone) [phone] (fax)  [place]

## 2020-10-01 NOTE — Patient Instructions (Signed)
Preventive Care 38 Years and Older, Female Preventive care refers to lifestyle choices and visits with your health care provider that can promote health and wellness. This includes:  A yearly physical exam. This is also called an annual well check.  Regular dental and eye exams.  Immunizations.  Screening for certain conditions.  Healthy lifestyle choices, such as diet and exercise. What can I expect for my preventive care visit? Physical exam Your health care provider will check:  Height and weight. These may be used to calculate body mass index (BMI), which is a measurement that tells if you are at a healthy weight.  Heart rate and blood pressure.  Your skin for abnormal spots. Counseling Your health care provider may ask you questions about:  Alcohol, tobacco, and drug use.  Emotional well-being.  Home and relationship well-being.  Sexual activity.  Eating habits.  History of falls.  Memory and ability to understand (cognition).  Work and work Statistician.  Pregnancy and menstrual history. What immunizations do I need?  Influenza (flu) vaccine  This is recommended every year. Tetanus, diphtheria, and pertussis (Tdap) vaccine  You may need a Td booster every 10 years. Varicella (chickenpox) vaccine  You may need this vaccine if you have not already been vaccinated. Zoster (shingles) vaccine  You may need this after age 33. Pneumococcal conjugate (PCV13) vaccine  One dose is recommended after age 33. Pneumococcal polysaccharide (PPSV23) vaccine  One dose is recommended after age 72. Measles, mumps, and rubella (MMR) vaccine  You may need at least one dose of MMR if you were born in 1957 or later. You may also need a second dose. Meningococcal conjugate (MenACWY) vaccine  You may need this if you have certain conditions. Hepatitis A vaccine  You may need this if you have certain conditions or if you travel or work in places where you may be exposed  to hepatitis A. Hepatitis B vaccine  You may need this if you have certain conditions or if you travel or work in places where you may be exposed to hepatitis B. Haemophilus influenzae type b (Hib) vaccine  You may need this if you have certain conditions. You may receive vaccines as individual doses or as more than one vaccine together in one shot (combination vaccines). Talk with your health care provider about the risks and benefits of combination vaccines. What tests do I need? Blood tests  Lipid and cholesterol levels. These may be checked every 5 years, or more frequently depending on your overall health.  Hepatitis C test.  Hepatitis B test. Screening  Lung cancer screening. You may have this screening every year starting at age 39 if you have a 30-pack-year history of smoking and currently smoke or have quit within the past 15 years.  Colorectal cancer screening. All adults should have this screening starting at age 36 and continuing until age 15. Your health care provider may recommend screening at age 23 if you are at increased risk. You will have tests every 1-10 years, depending on your results and the type of screening test.  Diabetes screening. This is done by checking your blood sugar (glucose) after you have not eaten for a while (fasting). You may have this done every 1-3 years.  Mammogram. This may be done every 1-2 years. Talk with your health care provider about how often you should have regular mammograms.  BRCA-related cancer screening. This may be done if you have a family history of breast, ovarian, tubal, or peritoneal cancers.  Other tests  Sexually transmitted disease (STD) testing.  Bone density scan. This is done to screen for osteoporosis. You may have this done starting at age 44. Follow these instructions at home: Eating and drinking  Eat a diet that includes fresh fruits and vegetables, whole grains, lean protein, and low-fat dairy products. Limit  your intake of foods with high amounts of sugar, saturated fats, and salt.  Take vitamin and mineral supplements as recommended by your health care provider.  Do not drink alcohol if your health care provider tells you not to drink.  If you drink alcohol: ? Limit how much you have to 0-1 drink a day. ? Be aware of how much alcohol is in your drink. In the [place] one drink equals one 12 oz bottle of beer (355 mL), one 5 oz glass of wine (148 mL), or one 1 oz glass of hard liquor (44 mL). Lifestyle  Take daily care of your teeth and gums.  Stay active. Exercise for at least 30 minutes on 5 or more days each week.  Do not use any products that contain nicotine or tobacco, such as cigarettes, e-cigarettes, and chewing tobacco. If you need help quitting, ask your health care provider.  If you are sexually active, practice safe sex. Use a condom or other form of protection in order to prevent STIs (sexually transmitted infections).  Talk with your health care provider about taking a low-dose aspirin or statin. What's next?  Go to your health care provider once a year for a well check visit.  Ask your health care provider how often you should have your eyes and teeth checked.  Stay up to date on all vaccines. This information is not intended to replace advice given to you by your health care provider. Make sure you discuss any questions you have with your health care provider. Document Revised: 10/10/2018 Document Reviewed: 10/10/2018 Elsevier Patient Education  2020 [employer].

## 2020-10-02 LAB — CBC WITH DIFFERENTIAL/PLATELET
Basophils Absolute: 0 10*3/uL (ref 0.0–0.2)
Basos: 1 %
EOS (ABSOLUTE): 0.2 10*3/uL (ref 0.0–0.4)
Eos: 3 %
Hematocrit: 39.9 % (ref 34.0–46.6)
Hemoglobin: 13 g/dL (ref 11.1–15.9)
Immature Grans (Abs): 0 10*3/uL (ref 0.0–0.1)
Immature Granulocytes: 0 %
Lymphocytes Absolute: 2.6 10*3/uL (ref 0.7–3.1)
Lymphs: 45 %
MCH: 26.6 pg (ref 26.6–33.0)
MCHC: 32.6 g/dL (ref 31.5–35.7)
MCV: 82 fL (ref 79–97)
Monocytes Absolute: 0.4 10*3/uL (ref 0.1–0.9)
Monocytes: 7 %
Neutrophils Absolute: 2.5 10*3/uL (ref 1.4–7.0)
Neutrophils: 44 %
Platelets: 315 10*3/uL (ref 150–450)
RBC: 4.89 x10E6/uL (ref 3.77–5.28)
RDW: 12.4 % (ref 11.7–15.4)
WBC: 5.7 10*3/uL (ref 3.4–10.8)

## 2020-10-02 LAB — COMPREHENSIVE METABOLIC PANEL
ALT: 12 IU/L (ref 0–32)
AST: 17 IU/L (ref 0–40)
Albumin/Globulin Ratio: 1.6 (ref 1.2–2.2)
Albumin: 4.1 g/dL (ref 3.8–4.8)
Alkaline Phosphatase: 77 IU/L (ref 44–121)
BUN/Creatinine Ratio: 14 (ref 12–28)
BUN: 11 mg/dL (ref 8–27)
Bilirubin Total: 0.4 mg/dL (ref 0.0–1.2)
CO2: 24 mmol/L (ref 20–29)
Calcium: 9.4 mg/dL (ref 8.7–10.3)
Chloride: 104 mmol/L (ref 96–106)
Creatinine, Ser: 0.79 mg/dL (ref 0.57–1.00)
GFR calc Af Amer: 89 mL/min/{1.73_m2} (ref >59)
GFR calc non Af Amer: 77 mL/min/{1.73_m2} (ref >59)
Globulin, Total: 2.5 g/dL (ref 1.5–4.5)
Glucose: 96 mg/dL (ref 65–99)
Potassium: 4.3 mmol/L (ref 3.5–5.2)
Sodium: 144 mmol/L (ref 134–144)
Total Protein: 6.6 g/dL (ref 6.0–8.5)

## 2020-10-02 LAB — TSH: TSH: 1.61 u[IU]/mL (ref 0.450–4.500)

## 2020-10-02 LAB — LIPID PANEL WITH LDL/HDL RATIO
Cholesterol, Total: 196 mg/dL (ref 100–199)
HDL: 43 mg/dL (ref >39)
LDL Chol Calc (NIH): 142 mg/dL — ABNORMAL HIGH (ref 0–99)
LDL/HDL Ratio: 3.3 ratio — ABNORMAL HIGH (ref 0.0–3.2)
Triglycerides: 62 mg/dL (ref 0–149)
VLDL Cholesterol Cal: 11 mg/dL (ref 5–40)

## 2020-10-02 LAB — HEMOGLOBIN A1C
Est. average glucose Bld gHb Est-mCnc: 128 mg/dL
Hgb A1c MFr Bld: 6.1 % — ABNORMAL HIGH (ref 4.8–5.6)

## 2020-10-02 LAB — VITAMIN D 25 HYDROXY (VIT D DEFICIENCY, FRACTURES): Vit D, 25-Hydroxy: 9.8 ng/mL — ABNORMAL LOW (ref 30.0–100.0)

## 2020-10-04 ENCOUNTER — Other Ambulatory Visit: Payer: Self-pay | Admitting: Physician Assistant

## 2020-10-04 ENCOUNTER — Telehealth: Payer: Self-pay

## 2020-10-04 DIAGNOSIS — E7800 Pure hypercholesterolemia, unspecified: Secondary | ICD-10-CM

## 2020-10-04 MED ORDER — ATORVASTATIN CALCIUM 10 MG PO TABS: 10.0000 mg | ORAL_TABLET | Freq: Every day | ORAL | 3 refills | Status: DC

## 2020-10-04 MED ORDER — VITAMIN D (ERGOCALCIFEROL) 1.25 MG (50000 UNIT) PO CAPS: 50000.0000 [IU] | ORAL_CAPSULE | ORAL | 3 refills | Status: DC

## 2020-10-04 NOTE — Telephone Encounter (Signed)
Pt advised.  She agreed to start a cholesterol medication.  Please sent to [place].

## 2020-10-04 NOTE — Telephone Encounter (Signed)
Atorvastatin sent in 

## 2020-10-04 NOTE — Telephone Encounter (Signed)
-----   Message from Mar Daring, [place] sent at 10/04/2020 12:51 PM EST ----- Blood count is normal. Kidney and liver function are normal. Sodium, potassium, and calcium are normal. Thyroid is normal. Cholesterol has increased compared to last year. Currently your risk of having a cardiovascular event over the next 10 years is elevated at 11.6%. Adding a cholesterol lowering medication could lower this risk to 6.3%. If desired I will send in cholesterol lowering medication to start. A1c/sugar has also increased compared to labs 3 years ago. Just continue working on healthy lifestyle modifications with dieting and exercise. Vit D is low. I will send in high dose Vit D supplement.

## 2020-10-04 NOTE — Telephone Encounter (Signed)
Requested Prescriptions  Pending Prescriptions Disp Refills  . alendronate (FOSAMAX) 70 MG tablet [Pharmacy Med Name: ALENDRONATE NA 70 MG TAB 70 Tablet] 8 tablet 12    Sig: TAKE 1 TABLET (70 MG TOTAL) BY MOUTH EVERY 7 (SEVEN) DAYS. TAKE WITH A FULL GLASS OF WATER ON AN EMPTY STOMACH.     Endocrinology:  Bisphosphonates Failed - 10/04/2020 10:34 AM      Failed - Vitamin D in normal range and within 360 days    Vit D, 25-Hydroxy  Date Value Ref Range Status  10/01/2020 9.8 (L) 30.0 - 100.0 ng/mL Final    Comment:    Vitamin D deficiency has been defined by the [place] practice guideline as a level of serum 25-OH vitamin D less than 20 ng/mL (1,2). The Endocrine Society went on to further define vitamin D insufficiency as a level between 21 and 29 ng/mL (2). 1. IOM (Institute of Medicine). 2010. Dietary reference    intakes for calcium and D. [place]: The    [employer]. 2. Holick MF, Binkley Villarreal, Bischoff-Ferrari HA, et al.    Evaluation, treatment, and prevention of vitamin D    deficiency: an Endocrine Society clinical practice    guideline. JCEM. 2011 Jul; 96(7):1911-30.          Passed - Ca in normal range and within 360 days    Calcium  Date Value Ref Range Status  10/01/2020 9.4 8.7 - 10.3 mg/dL Final         Passed - Valid encounter within last 12 months    Recent Outpatient Visits          3 days ago Annual physical exam   [facility] [place], Clearnce Sorrel, [place]   1 month ago Erroneous encounter - disregard   [place], [place]   1 year ago Acute non-recurrent pansinusitis   [facility] [place], Clearnce Sorrel, [place]   2 years ago Need for shingles vaccine   [facility] Birdie Sons, MD   2 years ago [place], Donald E, MD

## 2021-02-02 ENCOUNTER — Other Ambulatory Visit: Payer: Self-pay

## 2021-02-02 DIAGNOSIS — M533 Sacrococcygeal disorders, not elsewhere classified: Secondary | ICD-10-CM | POA: Diagnosis not present

## 2021-02-02 MED ORDER — METHOCARBAMOL 750 MG PO TABS
ORAL_TABLET | ORAL | 0 refills | Status: DC
  Filled 2021-02-02: qty 20, 20d supply, fill #0

## 2021-03-11 ENCOUNTER — Other Ambulatory Visit: Payer: Self-pay | Admitting: General Surgery

## 2021-03-11 DIAGNOSIS — C50412 Malignant neoplasm of upper-outer quadrant of left female breast: Secondary | ICD-10-CM

## 2021-03-11 NOTE — Progress Notes (Signed)
z

## 2021-03-30 DIAGNOSIS — H524 Presbyopia: Secondary | ICD-10-CM | POA: Diagnosis not present

## 2021-04-28 ENCOUNTER — Other Ambulatory Visit: Payer: Self-pay | Admitting: Family Medicine

## 2021-04-28 ENCOUNTER — Other Ambulatory Visit: Payer: Self-pay

## 2021-04-28 DIAGNOSIS — E6609 Other obesity due to excess calories: Secondary | ICD-10-CM

## 2021-04-28 DIAGNOSIS — Z6834 Body mass index (BMI) 34.0-34.9, adult: Secondary | ICD-10-CM

## 2021-04-28 NOTE — Telephone Encounter (Signed)
Requested medication (s) are due for refill today: yes  Requested medication (s) are on the active medication list: yes  Future visit scheduled:no  Notes to clinic:  this refill cannot be delegated    Requested Prescriptions  Pending Prescriptions Disp Refills   phentermine 37.5 MG capsule 30 capsule 2    Sig: TAKE 1 CAPSULE BY MOUTH EVERY MORNING      Not Delegated - Gastroenterology:  Antiobesity Agents Failed - 04/28/2021  1:53 PM      Failed - This refill cannot be delegated      Passed - Last BP in normal range    BP Readings from Last 1 Encounters:  10/01/20 139/60          Passed - Last Heart Rate in normal range    Pulse Readings from Last 1 Encounters:  10/01/20 82          Passed - Valid encounter within last 12 months    Recent Outpatient Visits           6 months ago Annual physical exam   [place], Clearnce Sorrel, [place]   7 months ago Erroneous encounter - disregard   [place], [place], [place]   2 years ago Acute non-recurrent pansinusitis   [facility] [place], [place], [place]   2 years ago Need for shingles vaccine   [facility] Birdie Sons, MD   2 years ago [place], Donald E, MD

## 2021-05-03 ENCOUNTER — Other Ambulatory Visit: Payer: Self-pay

## 2021-05-03 ENCOUNTER — Other Ambulatory Visit: Payer: Self-pay | Admitting: General Surgery

## 2021-05-03 DIAGNOSIS — M810 Age-related osteoporosis without current pathological fracture: Secondary | ICD-10-CM

## 2021-05-03 DIAGNOSIS — Z853 Personal history of malignant neoplasm of breast: Secondary | ICD-10-CM | POA: Diagnosis not present

## 2021-05-06 ENCOUNTER — Other Ambulatory Visit: Payer: Self-pay

## 2021-05-09 ENCOUNTER — Other Ambulatory Visit: Payer: Self-pay | Admitting: Physician Assistant

## 2021-05-09 DIAGNOSIS — E6609 Other obesity due to excess calories: Secondary | ICD-10-CM

## 2021-05-09 NOTE — Telephone Encounter (Signed)
  Requested medications are on the active medication list yes  Last visit 09/2020  Future visit scheduled 05/19/21  Notes to clinic Has upcoming appt, please assess.

## 2021-05-09 NOTE — Telephone Encounter (Signed)
Medication Refill - Medication: phentermine 37.5 MG capsule   Has the patient contacted their pharmacy? Yes.   (Agent: If no, request that the patient contact the pharmacy for the refill.) (Agent: If yes, when and what did the pharmacy advise?)call pcp  Preferred Pharmacy (with phone number or street name): [facility]  [address] [place] [address]  Phone:  [phone]  Fax:  [phone]   Agent: Please be advised that RX refills may take up to 3 business days. We ask that you follow-up with your pharmacy.

## 2021-05-10 ENCOUNTER — Other Ambulatory Visit (HOSPITAL_COMMUNITY): Payer: Self-pay

## 2021-05-10 DIAGNOSIS — H25013 Cortical age-related cataract, bilateral: Secondary | ICD-10-CM | POA: Diagnosis not present

## 2021-05-10 DIAGNOSIS — H2513 Age-related nuclear cataract, bilateral: Secondary | ICD-10-CM | POA: Diagnosis not present

## 2021-05-10 DIAGNOSIS — H18413 Arcus senilis, bilateral: Secondary | ICD-10-CM | POA: Diagnosis not present

## 2021-05-10 DIAGNOSIS — H25043 Posterior subcapsular polar age-related cataract, bilateral: Secondary | ICD-10-CM | POA: Diagnosis not present

## 2021-05-10 DIAGNOSIS — H2511 Age-related nuclear cataract, right eye: Secondary | ICD-10-CM | POA: Diagnosis not present

## 2021-05-10 MED ORDER — PROLENSA 0.07 % OP SOLN
1.0000 [drp] | Freq: Two times a day (BID) | OPHTHALMIC | 1 refills | Status: DC
  Filled 2021-05-10: qty 3, 28d supply, fill #0
  Filled 2021-07-08 (×2): qty 3, 13d supply, fill #0
  Filled 2021-07-08: qty 3, 22d supply, fill #0

## 2021-05-10 MED ORDER — DIFLUPREDNATE 0.05 % OP EMUL
OPHTHALMIC | 0 refills | Status: DC
  Filled 2021-05-10: qty 5, 25d supply, fill #0
  Filled 2021-07-08 – 2021-07-11 (×5): qty 5, 18d supply, fill #0

## 2021-05-10 MED ORDER — BESIVANCE 0.6 % OP SUSP
1.0000 [drp] | Freq: Three times a day (TID) | OPHTHALMIC | 1 refills | Status: DC
  Filled 2021-05-10 – 2021-07-08 (×2): qty 5, 34d supply, fill #0
  Filled 2021-07-08: qty 5, 30d supply, fill #0
  Filled 2021-07-08 (×3): qty 5, 34d supply, fill #0

## 2021-05-11 ENCOUNTER — Other Ambulatory Visit (HOSPITAL_COMMUNITY): Payer: Self-pay

## 2021-05-11 ENCOUNTER — Other Ambulatory Visit: Payer: Self-pay

## 2021-05-17 ENCOUNTER — Other Ambulatory Visit: Payer: 59

## 2021-05-19 ENCOUNTER — Ambulatory Visit: Payer: 59 | Admitting: Family Medicine

## 2021-05-20 ENCOUNTER — Ambulatory Visit
Admission: RE | Admit: 2021-05-20 | Discharge: 2021-05-20 | Disposition: A | Discharge status: Home / Self Care | Payer: BC Managed Care – PPO | Source: Ambulatory Visit | Attending: General Surgery | Admitting: General Surgery

## 2021-05-20 ENCOUNTER — Other Ambulatory Visit: Payer: Self-pay

## 2021-05-20 DIAGNOSIS — C50412 Malignant neoplasm of upper-outer quadrant of left female breast: Secondary | ICD-10-CM | POA: Insufficient documentation

## 2021-05-20 DIAGNOSIS — Z171 Estrogen receptor negative status [ER-]: Secondary | ICD-10-CM | POA: Diagnosis not present

## 2021-05-20 DIAGNOSIS — Z1231 Encounter for screening mammogram for malignant neoplasm of breast: Secondary | ICD-10-CM | POA: Diagnosis not present

## 2021-05-31 ENCOUNTER — Other Ambulatory Visit: Payer: Self-pay

## 2021-05-31 ENCOUNTER — Encounter: Payer: Self-pay | Admitting: Family Medicine

## 2021-05-31 ENCOUNTER — Ambulatory Visit: Payer: BC Managed Care – PPO | Admitting: Family Medicine

## 2021-05-31 VITALS — BP 136/62 | HR 80 | Temp 98.5°F | Wt 191.0 lb

## 2021-05-31 DIAGNOSIS — R7303 Prediabetes: Secondary | ICD-10-CM | POA: Diagnosis not present

## 2021-05-31 DIAGNOSIS — E559 Vitamin D deficiency, unspecified: Secondary | ICD-10-CM | POA: Diagnosis not present

## 2021-05-31 DIAGNOSIS — E669 Obesity, unspecified: Secondary | ICD-10-CM | POA: Diagnosis not present

## 2021-05-31 DIAGNOSIS — E7800 Pure hypercholesterolemia, unspecified: Secondary | ICD-10-CM | POA: Diagnosis not present

## 2021-05-31 NOTE — Progress Notes (Signed)
Established patient visit   Patient: Theresa Acosta   DOB: [birth year]   70 y.o. Female  MRN: [id number] Visit Date: 05/31/2021  Today's healthcare provider: Vernie Murders, PA-C   No chief complaint on file.  Subjective    HPI  Pre-Diabetes Mellitus Type II, Follow-up  Lab Results  Component Value Date   HGBA1C 6.1 (H) 10/01/2020   HGBA1C 5.9 (H) 04/03/2017   Wt Readings from Last 3 Encounters:  05/31/21 191 lb (86.6 kg)  10/01/20 193 lb 3.2 oz (87.6 kg)  09/09/20 196 lb (88.9 kg)   Last seen for diabetes 8 months ago.  Management since then includes none. Patient is currently not on any medication Symptoms: No fatigue No foot ulcerations  No appetite changes No nausea  No paresthesia of the feet  No polydipsia  No polyuria No visual disturbances   No vomiting     Home blood sugar records:  not being checked  Episodes of hypoglycemia? No     Pertinent Labs: Lab Results  Component Value Date   CHOL 196 10/01/2020   HDL 43 10/01/2020   LDLCALC 142 (H) 10/01/2020   TRIG 62 10/01/2020   CHOLHDL 4.3 07/10/2018   Lab Results  Component Value Date   NA 144 10/01/2020   K 4.3 10/01/2020   CREATININE 0.79 10/01/2020   GFRNONAA 77 10/01/2020   GFRAA 89 10/01/2020   GLUCOSE 96 10/01/2020     ---------------------------------------------------------------------------------------------------  Lipid/Cholesterol, Follow-up  Last lipid panel Other pertinent labs  Lab Results  Component Value Date   CHOL 196 10/01/2020   HDL 43 10/01/2020   LDLCALC 142 (H) 10/01/2020   TRIG 62 10/01/2020   CHOLHDL 4.3 07/10/2018   Lab Results  Component Value Date   ALT 12 10/01/2020   AST 17 10/01/2020   PLT 315 10/01/2020   TSH 1.610 10/01/2020     She was last seen for this 8 months ago.  Management since that visit includes none.  She reports good compliance with treatment. She is not having side effects.   Symptoms: No chest pain No chest  pressure/discomfort  No dyspnea No lower extremity edema  No numbness or tingling of extremity No orthopnea  No palpitations No paroxysmal nocturnal dyspnea  No speech difficulty No syncope    The 10-year ASCVD risk score Mikey Bussing DC Jr., et al., 2013) is: 11.3%  --------------------------------------------------------------------------------------------------- Patient is also here for vitamin D deficiency.  She was started on high dose vitamin D after last visit and present for follow up labs today  Patient would also like to be re-started on her diet pills.  She last got them in December.    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    BREAST BIOPSY Right 05/09/2016   neg   BREAST LUMPECTOMY Left 05/26/2016   [facility], DCIS,  clear margins, neg LN   BREAST LUMPECTOMY WITH NEEDLE LOCALIZATION Left 05/26/2016   Procedure: BREAST LUMPECTOMY WITH SENTINEL LYMPH NODE BX and needle localization;  Surgeon: Robert Bellow, MD;  Location: [facility] ORS;  Service: General;  Laterality: Left;   BREAST MAMMOSITE Left 06/26/2016   BUNIONECTOMY  2012   COLONOSCOPY WITH PROPOFOL N/A 03/21/2016   Procedure: COLONOSCOPY WITH PROPOFOL;  Surgeon: Robert Bellow, MD;  Location: [facility] ENDOSCOPY;  Service: Endoscopy;  Laterality: N/A;   SENTINEL NODE BIOPSY Left 05/26/2016   Procedure: SENTINEL NODE BIOPSY;   Surgeon: Robert Bellow, MD;  Location: [facility] ORS;  Service: General;  Laterality: Left;   TUBAL LIGATION  1977   Social History   Tobacco Use   Smoking status: Former    Packs/day: 0.50    Years: 30.00    Pack years: 15.00    Types: Cigarettes    Quit date: 02/27/2016    Years since quitting: 5.2   Smokeless tobacco: Never   Tobacco comments:    1/2 ppd since age 67 quit age 65  Substance Use Topics   Alcohol use: No    Alcohol/week: 0.0 standard drinks   Drug use: No   Family Status  Relation Name Status   Father  Deceased at age 44   Mother  Deceased at age 60       pneumonia   Sister  Alive   Brother  Comptroller   Sister  Alive   Brother  Alive   Brother  Alive   Neg Hx  (Not Specified)   No Known Allergies     Medications: Outpatient Medications Prior to Visit  Medication Sig   alendronate (FOSAMAX) 70 MG tablet TAKE 1 TABLET (70 MG TOTAL) BY MOUTH EVERY 7 (SEVEN) DAYS. TAKE WITH A FULL GLASS OF WATER ON AN EMPTY STOMACH.   atorvastatin (LIPITOR) 10 MG tablet TAKE 1 TABLET BY MOUTH DAILY   BESIVANCE 0.6 % SUSP Place 1 drop into the right eye 3 (three) times daily as directed   Cholecalciferol (VITAMIN D3) 1000 units CAPS Take 1 capsule by mouth once a week.   Difluprednate 0.05 % EMUL Instill 1 drop into right eye 4 times a day as directed   loratadine (CLARITIN) 10 MG tablet Take 10 mg by mouth daily.   methocarbamol (ROBAXIN) 750 MG tablet Take 1 tablet every day by mouth at bedtime.   PROLENSA 0.07 % SOLN Place 1 drop into the right eye 2 (two) times daily as directed   Vitamin D, Ergocalciferol, (DRISDOL) 1.25 MG (50000 UNIT) CAPS capsule TAKE 1 CAPSULE (50,000 UNITS TOTAL) BY MOUTH EVERY 7 (SEVEN) DAYS.   phentermine 37.5 MG capsule TAKE 1 CAPSULE BY MOUTH EVERY MORNING   No facility-administered medications prior to visit.    Review of Systems  Constitutional: Negative.   HENT: Negative.    Respiratory: Negative.    Cardiovascular: Negative.    Gastrointestinal: Negative.   Musculoskeletal: Negative.    Last CBC Lab Results  Component Value Date   WBC 5.7 10/01/2020   HGB 13.0 10/01/2020   HCT 39.9 10/01/2020   MCV 82 10/01/2020   MCH 26.6 10/01/2020   RDW 12.4 10/01/2020   PLT 315 [id number]   Last metabolic panel Lab Results  Component Value Date   GLUCOSE 96 10/01/2020   NA 144 10/01/2020   K 4.3 10/01/2020   CL 104 10/01/2020   CO2 24 10/01/2020   BUN 11 10/01/2020   CREATININE 0.79 10/01/2020  GFRNONAA 77 10/01/2020   GFRAA 89 10/01/2020   CALCIUM 9.4 10/01/2020   PROT 6.6 10/01/2020   ALBUMIN 4.1 10/01/2020   LABGLOB 2.5 10/01/2020   AGRATIO 1.6 10/01/2020   BILITOT 0.4 10/01/2020   ALKPHOS 77 10/01/2020   AST 17 10/01/2020   ALT 12 10/01/2020   ANIONGAP 8 12/14/2017   Last hemoglobin A1c Lab Results  Component Value Date   HGBA1C 6.1 (H) 10/01/2020   Last vitamin D Lab Results  Component Value Date   VD25OH 9.8 (L) 10/01/2020       Objective    BP 136/62 (BP Location: Right Arm, Patient Position: Sitting, Cuff Size: Large)   Pulse 80   Temp 98.5 F (36.9 C) (Oral)   Wt 191 lb (86.6 kg)   SpO2 100%   BMI 33.83 kg/m  BP Readings from Last 3 Encounters:  05/31/21 136/62  10/01/20 139/60  09/09/20 140/75   Wt Readings from Last 3 Encounters:  05/31/21 191 lb (86.6 kg)  10/01/20 193 lb 3.2 oz (87.6 kg)  09/09/20 196 lb (88.9 kg)    Physical Exam Constitutional:      General: She is not in acute distress.    Appearance: She is well-developed.  HENT:     Head: Normocephalic and atraumatic.     Right Ear: Hearing and tympanic membrane normal.     Left Ear: Hearing and tympanic membrane normal.     Nose: Nose normal.  Eyes:     General: Lids are normal. No scleral icterus.       Right eye: No discharge.        Left eye: No discharge.     Conjunctiva/sclera: Conjunctivae normal.  Cardiovascular:     Rate and Rhythm: Normal rate and regular rhythm.     Pulses: Normal  pulses.     Heart sounds: Normal heart sounds.  Pulmonary:     Effort: Pulmonary effort is normal. No respiratory distress.     Breath sounds: Normal breath sounds.  Abdominal:     General: Bowel sounds are normal.     Palpations: Abdomen is soft.  Musculoskeletal:        General: Swelling present. Normal range of motion.     Cervical back: Neck supple.     Comments: Mild pitting edema of lower legs anteriorly.  Skin:    Findings: No lesion or rash.  Neurological:     Mental Status: She is alert and oriented to person, place, and time.  Psychiatric:        Speech: Speech normal.        Behavior: Behavior normal.        Thought Content: Thought content normal.      No results found for any visits on 05/31/21.  Assessment & Plan     1. Prediabetes Last hgb A1C in Dec. 2021 was 6.1. No polyuria, polydipsia or peripheral neuropathy. Some edema of lower legs. Recommend follow up labs and restriction of salt intake. Recheck pending lab reports. - Hemoglobin A1c - Lipid Panel With LDL/HDL Ratio - CBC with Differential/Platelet - Comprehensive metabolic panel  2. Hypercholesterolemia No longer taking Atorvastatin due to muscle and joint aches. Poor diet and unable to exercise much due to work schedule. Recheck labs. - Lipid Panel With LDL/HDL Ratio - TSH - CBC with Differential/Platelet - Comprehensive metabolic panel  3. Obesity, Class I, BMI 30-34.9 Has lost 5 lbs in the past year. Has not used the Phentermine much in the past  6 months. Will recheck labs. Still has a heavy work schedule with poor eating habits. - Hemoglobin A1c - TSH - CBC with Differential/Platelet - Comprehensive metabolic panel  4. Vitamin D deficiency No longer taking Vitamin D supplements. Recheck labs. Last Vitamin D level on 10-01-20 was 9.8. - Vitamin D (25 hydroxy) - CBC with Differential/Platelet   No follow-ups on file.      I, Valory Wetherby, PA-C, have reviewed all documentation for  this visit. The documentation on 05/31/21 for the exam, diagnosis, procedures, and orders are all accurate and complete.    Vernie Murders, PA-C  [employer] [phone] (phone) [phone] (fax)  [place]

## 2021-06-01 LAB — COMPREHENSIVE METABOLIC PANEL
ALT: 11 IU/L (ref 0–32)
AST: 16 IU/L (ref 0–40)
Albumin/Globulin Ratio: 1.8 (ref 1.2–2.2)
Albumin: 3.9 g/dL (ref 3.8–4.8)
Alkaline Phosphatase: 71 IU/L (ref 44–121)
BUN/Creatinine Ratio: 13 (ref 12–28)
BUN: 9 mg/dL (ref 8–27)
Bilirubin Total: 0.4 mg/dL (ref 0.0–1.2)
CO2: 28 mmol/L (ref 20–29)
Calcium: 9.2 mg/dL (ref 8.7–10.3)
Chloride: 106 mmol/L (ref 96–106)
Creatinine, Ser: 0.69 mg/dL (ref 0.57–1.00)
Globulin, Total: 2.2 g/dL (ref 1.5–4.5)
Glucose: 91 mg/dL (ref 65–99)
Potassium: 3.9 mmol/L (ref 3.5–5.2)
Sodium: 145 mmol/L — ABNORMAL HIGH (ref 134–144)
Total Protein: 6.1 g/dL (ref 6.0–8.5)
eGFR: 94 mL/min/{1.73_m2} (ref >59)

## 2021-06-01 LAB — TSH: TSH: 1.4 u[IU]/mL (ref 0.450–4.500)

## 2021-06-01 LAB — CBC WITH DIFFERENTIAL/PLATELET
Basophils Absolute: 0 10*3/uL (ref 0.0–0.2)
Basos: 0 %
EOS (ABSOLUTE): 0.1 10*3/uL (ref 0.0–0.4)
Eos: 3 %
Hematocrit: 38.1 % (ref 34.0–46.6)
Hemoglobin: 12.7 g/dL (ref 11.1–15.9)
Immature Grans (Abs): 0 10*3/uL (ref 0.0–0.1)
Immature Granulocytes: 0 %
Lymphocytes Absolute: 2.3 10*3/uL (ref 0.7–3.1)
Lymphs: 48 %
MCH: 27 pg (ref 26.6–33.0)
MCHC: 33.3 g/dL (ref 31.5–35.7)
MCV: 81 fL (ref 79–97)
Monocytes Absolute: 0.3 10*3/uL (ref 0.1–0.9)
Monocytes: 7 %
Neutrophils Absolute: 2.1 10*3/uL (ref 1.4–7.0)
Neutrophils: 42 %
Platelets: 284 10*3/uL (ref 150–450)
RBC: 4.7 x10E6/uL (ref 3.77–5.28)
RDW: 11.5 % — ABNORMAL LOW (ref 11.7–15.4)
WBC: 4.9 10*3/uL (ref 3.4–10.8)

## 2021-06-01 LAB — HEMOGLOBIN A1C
Est. average glucose Bld gHb Est-mCnc: 126 mg/dL
Hgb A1c MFr Bld: 6 % — ABNORMAL HIGH (ref 4.8–5.6)

## 2021-06-01 LAB — LIPID PANEL WITH LDL/HDL RATIO
Cholesterol, Total: 153 mg/dL (ref 100–199)
HDL: 33 mg/dL — ABNORMAL LOW (ref >39)
LDL Chol Calc (NIH): 107 mg/dL — ABNORMAL HIGH (ref 0–99)
LDL/HDL Ratio: 3.2 ratio (ref 0.0–3.2)
Triglycerides: 66 mg/dL (ref 0–149)
VLDL Cholesterol Cal: 13 mg/dL (ref 5–40)

## 2021-06-01 LAB — VITAMIN D 25 HYDROXY (VIT D DEFICIENCY, FRACTURES): Vit D, 25-Hydroxy: 13.9 ng/mL — ABNORMAL LOW (ref 30.0–100.0)

## 2021-07-08 ENCOUNTER — Other Ambulatory Visit: Payer: Self-pay

## 2021-07-11 ENCOUNTER — Other Ambulatory Visit: Payer: Self-pay

## 2021-07-11 MED ORDER — PREDNISOLONE ACETATE 1 % OP SUSP
OPHTHALMIC | 1 refills | Status: DC
  Filled 2021-07-11: qty 5, 18d supply, fill #0

## 2021-07-11 MED ORDER — KETOROLAC TROMETHAMINE 0.5 % OP SOLN
OPHTHALMIC | 1 refills | Status: DC
  Filled 2021-07-11: qty 3, 11d supply, fill #0

## 2021-07-13 DIAGNOSIS — H2511 Age-related nuclear cataract, right eye: Secondary | ICD-10-CM | POA: Diagnosis not present

## 2021-07-14 ENCOUNTER — Other Ambulatory Visit (HOSPITAL_COMMUNITY): Payer: Self-pay

## 2021-07-14 DIAGNOSIS — Z961 Presence of intraocular lens: Secondary | ICD-10-CM | POA: Diagnosis not present

## 2021-07-14 DIAGNOSIS — H2512 Age-related nuclear cataract, left eye: Secondary | ICD-10-CM | POA: Diagnosis not present

## 2021-07-14 MED ORDER — KETOROLAC TROMETHAMINE 0.5 % OP SOLN
1.0000 [drp] | Freq: Four times a day (QID) | OPHTHALMIC | 0 refills | Status: DC
  Filled 2021-07-14: qty 3, 15d supply, fill #0
  Filled 2021-08-19: qty 5, 18d supply, fill #0
  Filled 2021-08-19: qty 3, 15d supply, fill #0

## 2021-07-14 MED ORDER — PREDNISOLONE ACETATE 1 % OP SUSP
1.0000 [drp] | OPHTHALMIC | 0 refills | Status: DC
  Filled 2021-07-14: qty 5, 11d supply, fill #0
  Filled 2021-08-19 (×2): qty 5, 6d supply, fill #0

## 2021-07-14 MED ORDER — BESIVANCE 0.6 % OP SUSP
1.0000 [drp] | Freq: Three times a day (TID) | OPHTHALMIC | 0 refills | Status: DC
  Filled 2021-07-14: qty 5, 30d supply, fill #0
  Filled 2021-08-19: qty 5, 34d supply, fill #0
  Filled 2021-08-19: qty 5, 30d supply, fill #0

## 2021-07-18 ENCOUNTER — Other Ambulatory Visit (HOSPITAL_COMMUNITY): Payer: Self-pay

## 2021-08-19 ENCOUNTER — Other Ambulatory Visit: Payer: Self-pay

## 2021-08-24 DIAGNOSIS — H2512 Age-related nuclear cataract, left eye: Secondary | ICD-10-CM | POA: Diagnosis not present

## 2021-12-12 ENCOUNTER — Telehealth: Payer: Self-pay | Admitting: Acute Care

## 2021-12-12 NOTE — Telephone Encounter (Signed)
Left voicemail with call back number to call and schedule annual LDCT

## 2022-06-13 ENCOUNTER — Other Ambulatory Visit: Payer: Self-pay | Admitting: General Surgery

## 2022-06-13 DIAGNOSIS — Z1231 Encounter for screening mammogram for malignant neoplasm of breast: Secondary | ICD-10-CM

## 2022-07-10 ENCOUNTER — Ambulatory Visit
Admission: RE | Admit: 2022-07-10 | Discharge: 2022-07-10 | Disposition: A | Discharge status: Home / Self Care | Payer: BC Managed Care – PPO | Source: Ambulatory Visit | Attending: General Surgery | Admitting: General Surgery

## 2022-07-10 DIAGNOSIS — Z1231 Encounter for screening mammogram for malignant neoplasm of breast: Secondary | ICD-10-CM | POA: Diagnosis not present

## 2022-07-18 DIAGNOSIS — Z853 Personal history of malignant neoplasm of breast: Secondary | ICD-10-CM | POA: Diagnosis not present

## 2022-07-18 DIAGNOSIS — Z0189 Encounter for other specified special examinations: Secondary | ICD-10-CM | POA: Diagnosis not present

## 2023-06-22 ENCOUNTER — Ambulatory Visit (INDEPENDENT_AMBULATORY_CARE_PROVIDER_SITE_OTHER): Payer: BC Managed Care – PPO | Admitting: Family Medicine

## 2023-06-22 ENCOUNTER — Other Ambulatory Visit: Payer: Self-pay | Admitting: Family Medicine

## 2023-06-22 ENCOUNTER — Ambulatory Visit: Payer: Self-pay

## 2023-06-22 DIAGNOSIS — Z91199 Patient's noncompliance with other medical treatment and regimen due to unspecified reason: Secondary | ICD-10-CM

## 2023-06-22 DIAGNOSIS — Z1231 Encounter for screening mammogram for malignant neoplasm of breast: Secondary | ICD-10-CM

## 2023-06-22 NOTE — Progress Notes (Signed)
Patient was not seen for appt d/t no call, no show, or late arrival >10 mins past appt time.   Elise T Payne, FNP  [facility] [address] [place], [place] [address] [phone] (phone) [phone] (fax) [facility]  

## 2023-06-22 NOTE — Telephone Encounter (Signed)
    Chief Complaint: Pt. Has vomiting, diarrhea Symptoms: Above Frequency: Today Pertinent Negatives: Patient denies any pain Disposition: [] ED /[] Urgent Care (no appt availability in office) / [x] Appointment(In office/virtual)/ []  [place] [facility]/ [] Home Care/ [] Refused Recommended Disposition /[] [place] [facility]/ []  Follow-up with PCP Additional Notes: Pt. Already has appointment today, agrees to keep appointment.  Reason for Disposition  [1] MILD or MODERATE vomiting AND [2] present > 48 hours (2 days) (Exception: Mild vomiting with associated diarrhea.)  Answer Assessment - Initial Assessment Questions 1. VOMITING SEVERITY: "How many times have you vomited in the past 24 hours?"     - MILD:  1 - 2 times/day    - MODERATE: 3 - 5 times/day, decreased oral intake without significant weight loss or symptoms of dehydration    - SEVERE: 6 or more times/day, vomits everything or nearly everything, with significant weight loss, symptoms of dehydration      2 2. ONSET: "When did the vomiting begin?"      Today 3. FLUIDS: "What fluids or food have you vomited up today?" "Have you been able to keep any fluids down?"     Yes 4. ABDOMEN PAIN: "Are your having any abdomen pain?" If Yes : "How bad is it and what does it feel like?" (e.g., crampy, dull, intermittent, constant)      No 5. DIARRHEA: "Is there any diarrhea?" If Yes, ask: "How many times today?"      2-3 6. CONTACTS: "Is there anyone else in the family with the same symptoms?"      No 7. CAUSE: "What do you think is causing your vomiting?"     Unsure 8. HYDRATION STATUS: "Any signs of dehydration?" (e.g., dry mouth [not only dry lips], too weak to stand) "When did you last urinate?"     Yes 9. OTHER SYMPTOMS: "Do you have any other symptoms?" (e.g., fever, headache, vertigo, vomiting blood or coffee grounds, recent head injury)     No 10. PREGNANCY: "Is there any chance you are pregnant?" "When was your last menstrual  period?"       No  Protocols used: Vomiting-A-AH

## 2023-06-27 ENCOUNTER — Encounter: Payer: Self-pay | Admitting: Family Medicine

## 2023-06-27 ENCOUNTER — Ambulatory Visit: Payer: Medicare HMO | Admitting: Family Medicine

## 2023-06-27 VITALS — BP 127/78 | HR 82 | Temp 98.2°F | Resp 16 | Ht 63.0 in | Wt 181.2 lb

## 2023-06-27 DIAGNOSIS — G4733 Obstructive sleep apnea (adult) (pediatric): Secondary | ICD-10-CM | POA: Diagnosis not present

## 2023-06-27 DIAGNOSIS — E559 Vitamin D deficiency, unspecified: Secondary | ICD-10-CM | POA: Diagnosis not present

## 2023-06-27 DIAGNOSIS — M810 Age-related osteoporosis without current pathological fracture: Secondary | ICD-10-CM

## 2023-06-27 DIAGNOSIS — R300 Dysuria: Secondary | ICD-10-CM

## 2023-06-27 DIAGNOSIS — R002 Palpitations: Secondary | ICD-10-CM | POA: Diagnosis not present

## 2023-06-27 DIAGNOSIS — Z1231 Encounter for screening mammogram for malignant neoplasm of breast: Secondary | ICD-10-CM | POA: Diagnosis not present

## 2023-06-27 DIAGNOSIS — R7303 Prediabetes: Secondary | ICD-10-CM

## 2023-06-27 DIAGNOSIS — E7800 Pure hypercholesterolemia, unspecified: Secondary | ICD-10-CM | POA: Diagnosis not present

## 2023-06-27 NOTE — Assessment & Plan Note (Signed)
Chronic; untreated Repeat LP

## 2023-06-27 NOTE — Assessment & Plan Note (Signed)
Chronic; recommend repeat DEXA

## 2023-06-27 NOTE — Assessment & Plan Note (Signed)
Chronic; unknown Repeat labs given vit d previous deficiency and OP

## 2023-06-27 NOTE — Assessment & Plan Note (Signed)
Recommend UA/Ucx Continue to monitor Defer STI screening at this time; no voiced concerns

## 2023-06-27 NOTE — Assessment & Plan Note (Signed)
Acute, could be situational given stress of recent illness in sister; however, recommend TSH given poor adherence to appts

## 2023-06-27 NOTE — Assessment & Plan Note (Signed)
Chronic; unknown Repeat A1c and UMACR given weight loss, non intentional and GI symptoms

## 2023-06-27 NOTE — Assessment & Plan Note (Signed)
Recommend repeat PSG given need for new equipment

## 2023-06-27 NOTE — Progress Notes (Signed)
Established patient visit   Patient: Theresa Acosta   DOB: 04/06/1951   72 y.o. Female  MRN: [id number] Visit Date: 06/27/2023  Today's healthcare provider: Jacky Kindle, FNP  Introduced to nurse practitioner role and practice setting.  All questions answered.  Discussed provider/patient relationship and expectations.  Subjective    HPI HPI     Follow-up    Additional comments: Patients states not taking any medications at this time.  States she never got rx for chol. Med?      Last edited by Marcos Eke, CMA on 06/27/2023  1:12 PM.      Medications: Outpatient Medications Prior to Visit  Medication Sig   BESIVANCE 0.6 % SUSP Place 1 drop into the right eye 3 (three) times daily as directed (Patient not taking: Reported on 06/27/2023)   [DISCONTINUED] atorvastatin (LIPITOR) 10 MG tablet TAKE 1 TABLET BY MOUTH DAILY   [DISCONTINUED] BESIVANCE 0.6 % SUSP Place 1 drop into the left eye 3 (three) times daily. (Patient not taking: Reported on 06/27/2023)   [DISCONTINUED] Cholecalciferol (VITAMIN D3) 1000 units CAPS Take 1 capsule by mouth once a week. (Patient not taking: Reported on 06/27/2023)   [DISCONTINUED] Difluprednate 0.05 % EMUL Instill 1 drop into right eye 4 times a day as directed (Patient not taking: Reported on 06/27/2023)   [DISCONTINUED] ketorolac (ACULAR) 0.5 % ophthalmic solution Instill one drop into right eye four times daily (Patient not taking: Reported on 06/27/2023)   [DISCONTINUED] ketorolac (ACULAR) 0.5 % ophthalmic solution Place 1 drop into the left eye 4 (four) times daily. (Patient not taking: Reported on 06/27/2023)   [DISCONTINUED] loratadine (CLARITIN) 10 MG tablet Take 10 mg by mouth daily. (Patient not taking: Reported on 06/27/2023)   [DISCONTINUED] methocarbamol (ROBAXIN) 750 MG tablet Take 1 tablet every day by mouth at bedtime. (Patient not taking: Reported on 06/27/2023)   [DISCONTINUED] phentermine 37.5 MG capsule TAKE 1 CAPSULE BY MOUTH EVERY  MORNING   [DISCONTINUED] prednisoLONE acetate (PRED FORTE) 1 % ophthalmic suspension Instill one drop into right eye four times daily (Patient not taking: Reported on 06/27/2023)   [DISCONTINUED] prednisoLONE acetate (PRED FORTE) 1 % ophthalmic suspension Place 1 drop into the left eye every 2 hours (Patient not taking: Reported on 06/27/2023)   [DISCONTINUED] PROLENSA 0.07 % SOLN Place 1 drop into the right eye 2 (two) times daily as directed (Patient not taking: Reported on 06/27/2023)   No facility-administered medications prior to visit.    Review of Systems    Objective    BP 127/78   Pulse 82   Temp 98.2 F (36.8 C)   Resp 16   Ht 5\' 3"  (1.6 m)   Wt 181 lb 3.2 oz (82.2 kg)   SpO2 93%   BMI 32.10 kg/m    Physical Exam Vitals and nursing note reviewed.  Constitutional:      General: She is not in acute distress.    Appearance: Normal appearance. She is obese. She is not ill-appearing, toxic-appearing or diaphoretic.  HENT:     Head: Normocephalic and atraumatic.  Cardiovascular:     Rate and Rhythm: Normal rate and regular rhythm.     Pulses: Normal pulses.     Heart sounds: Normal heart sounds. No murmur heard.    No friction rub. No gallop.  Pulmonary:     Effort: Pulmonary effort is normal. No respiratory distress.     Breath sounds: Normal breath sounds. No stridor. No wheezing, rhonchi or rales.  Chest:     Chest wall: No tenderness.  Musculoskeletal:        General: No swelling, tenderness, deformity or signs of injury. Normal range of motion.     Right lower leg: No edema.     Left lower leg: No edema.  Skin:    General: Skin is warm and dry.     Capillary Refill: Capillary refill takes less than 2 seconds.     Coloration: Skin is not jaundiced or pale.     Findings: No bruising, erythema, lesion or rash.  Neurological:     General: No focal deficit present.     Mental Status: She is alert and oriented to person, place, and time. Mental status is at  baseline.     Cranial Nerves: No cranial nerve deficit.     Sensory: No sensory deficit.     Motor: No weakness.     Coordination: Coordination normal.  Psychiatric:        Mood and Affect: Mood normal.        Behavior: Behavior normal.        Thought Content: Thought content normal.        Judgment: Judgment normal.     No results found for any visits on 06/27/23.  Assessment & Plan     Problem List Items Addressed This Visit       Respiratory   OSA on CPAP    Recommend repeat PSG given need for new equipment      Relevant Orders   PSG Sleep Study     Musculoskeletal and Integument   Osteoporosis    Chronic; recommend repeat DEXA      Relevant Orders   DG Bone Density     Other   Dysuria    Recommend UA/Ucx Continue to monitor Defer STI screening at this time; no voiced concerns      Relevant Orders   Urine Microalbumin w/creat. ratio   Urinalysis, Routine w reflex microscopic   Urine Culture   Hypercholesteremia    Chronic; untreated Repeat LP       Relevant Orders   Comprehensive metabolic panel   Lipid Panel With LDL/HDL Ratio   CBC with Differential/Platelet   Palpitations    Acute, could be situational given stress of recent illness in sister; however, recommend TSH given poor adherence to appts      Relevant Orders   TSH   Prediabetes    Chronic; unknown Repeat A1c and UMACR given weight loss, non intentional and GI symptoms       Relevant Orders   Hemoglobin A1c   Comprehensive metabolic panel   CBC with Differential/Platelet   Screening mammogram for breast cancer   Relevant Orders   MM 3D SCREENING MAMMOGRAM BILATERAL BREAST   Vitamin D deficiency - Primary    Chronic; unknown Repeat labs given vit d previous deficiency and OP      Relevant Orders   VITAMIN D 25 Hydroxy (Vit-D Deficiency, Fractures)   Return for AWV.     Leilani Merl, FNP, have reviewed all documentation for this visit. The documentation on 06/27/23 for  the exam, diagnosis, procedures, and orders are all accurate and complete.  Jacky Kindle, FNP  [facility] [phone] (phone) [phone] (fax)  [facility]

## 2023-06-28 LAB — MICROALBUMIN / CREATININE URINE RATIO
Creatinine, Urine: 201.1 mg/dL
Microalb/Creat Ratio: 5 mg/g{creat} (ref 0–29)
Microalbumin, Urine: 9.3 ug/mL

## 2023-06-28 LAB — CBC WITH DIFFERENTIAL/PLATELET
Basophils Absolute: 0 10*3/uL (ref 0.0–0.2)
Basos: 1 %
EOS (ABSOLUTE): 0.3 10*3/uL (ref 0.0–0.4)
Eos: 4 %
Hematocrit: 43.7 % (ref 34.0–46.6)
Hemoglobin: 13.5 g/dL (ref 11.1–15.9)
Immature Grans (Abs): 0 10*3/uL (ref 0.0–0.1)
Immature Granulocytes: 0 %
Lymphocytes Absolute: 2.1 10*3/uL (ref 0.7–3.1)
Lymphs: 30 %
MCH: 26.7 pg (ref 26.6–33.0)
MCHC: 30.9 g/dL — ABNORMAL LOW (ref 31.5–35.7)
MCV: 87 fL (ref 79–97)
Monocytes Absolute: 0.4 10*3/uL (ref 0.1–0.9)
Monocytes: 6 %
Neutrophils Absolute: 4.1 10*3/uL (ref 1.4–7.0)
Neutrophils: 59 %
Platelets: 293 10*3/uL (ref 150–450)
RBC: 5.05 x10E6/uL (ref 3.77–5.28)
RDW: 12 % (ref 11.7–15.4)
WBC: 7 10*3/uL (ref 3.4–10.8)

## 2023-06-28 LAB — COMPREHENSIVE METABOLIC PANEL
ALT: 12 IU/L (ref 0–32)
AST: 12 IU/L (ref 0–40)
Albumin: 3.9 g/dL (ref 3.9–4.9)
Alkaline Phosphatase: 79 IU/L (ref 44–121)
BUN/Creatinine Ratio: 14 (ref 12–28)
BUN: 12 mg/dL (ref 8–27)
Bilirubin Total: 0.2 mg/dL (ref 0.0–1.2)
CO2: 25 mmol/L (ref 20–29)
Calcium: 9.5 mg/dL (ref 8.7–10.3)
Chloride: 107 mmol/L — ABNORMAL HIGH (ref 96–106)
Creatinine, Ser: 0.86 mg/dL (ref 0.57–1.00)
Globulin, Total: 2.6 g/dL (ref 1.5–4.5)
Glucose: 90 mg/dL (ref 70–99)
Potassium: 4.5 mmol/L (ref 3.5–5.2)
Sodium: 146 mmol/L — ABNORMAL HIGH (ref 134–144)
Total Protein: 6.5 g/dL (ref 6.0–8.5)
eGFR: 73 mL/min/{1.73_m2} (ref >59)

## 2023-06-28 LAB — URINALYSIS, ROUTINE W REFLEX MICROSCOPIC
Bilirubin, UA: NEGATIVE
Glucose, UA: NEGATIVE
Nitrite, UA: NEGATIVE
Specific Gravity, UA: 1.022 (ref 1.005–1.030)
Urobilinogen, Ur: 0.2 mg/dL (ref 0.2–1.0)
pH, UA: 5.5 (ref 5.0–7.5)

## 2023-06-28 LAB — LIPID PANEL WITH LDL/HDL RATIO
Cholesterol, Total: 184 mg/dL (ref 100–199)
HDL: 38 mg/dL — ABNORMAL LOW (ref >39)
LDL Chol Calc (NIH): 121 mg/dL — ABNORMAL HIGH (ref 0–99)
LDL/HDL Ratio: 3.2 ratio (ref 0.0–3.2)
Triglycerides: 137 mg/dL (ref 0–149)
VLDL Cholesterol Cal: 25 mg/dL (ref 5–40)

## 2023-06-28 LAB — HEMOGLOBIN A1C
Est. average glucose Bld gHb Est-mCnc: 128 mg/dL
Hgb A1c MFr Bld: 6.1 % — ABNORMAL HIGH (ref 4.8–5.6)

## 2023-06-28 LAB — SPECIMEN STATUS REPORT

## 2023-06-28 LAB — TSH: TSH: 1.49 u[IU]/mL (ref 0.450–4.500)

## 2023-06-28 LAB — MICROSCOPIC EXAMINATION
Bacteria, UA: NONE SEEN
Casts: NONE SEEN /LPF
WBC, UA: NONE SEEN /HPF (ref 0–5)

## 2023-06-28 LAB — VITAMIN D 25 HYDROXY (VIT D DEFICIENCY, FRACTURES): Vit D, 25-Hydroxy: 15.3 ng/mL — ABNORMAL LOW (ref 30.0–100.0)

## 2023-06-28 NOTE — Progress Notes (Signed)
Pt reported some vague GI symptoms in setting of family stressors- red blood cell and some ketones and leukocytes noted on urinary exam. Encourage follow up appt if needed and encourage water intake to assist at 48 oz/day.

## 2023-06-29 LAB — URINE CULTURE

## 2023-06-29 LAB — SPECIMEN STATUS REPORT

## 2023-06-29 NOTE — Progress Notes (Signed)
Patient advised. Verbalized understanding 

## 2023-08-02 ENCOUNTER — Other Ambulatory Visit: Payer: BC Managed Care – PPO

## 2023-09-18 ENCOUNTER — Encounter: Payer: Self-pay | Admitting: Family Medicine

## 2023-09-18 ENCOUNTER — Ambulatory Visit (INDEPENDENT_AMBULATORY_CARE_PROVIDER_SITE_OTHER): Payer: Medicare HMO | Admitting: Family Medicine

## 2023-09-18 VITALS — BP 144/67 | HR 72 | Ht 63.0 in | Wt 180.0 lb

## 2023-09-18 DIAGNOSIS — R030 Elevated blood-pressure reading, without diagnosis of hypertension: Secondary | ICD-10-CM | POA: Diagnosis not present

## 2023-09-18 DIAGNOSIS — Z0000 Encounter for general adult medical examination without abnormal findings: Secondary | ICD-10-CM | POA: Diagnosis not present

## 2023-09-18 NOTE — Progress Notes (Signed)
Subjective:    Theresa Acosta is a 72 y.o. female who presents for a Welcome to Medicare exam.       Objective:    Today's Vitals   09/18/23 1104  BP: (!) 156/62  SpO2: 97%  Weight: 180 lb (81.6 kg)  Height: 5\' 3"  (1.6 m)  Body mass index is 31.89 kg/m.  Medications Outpatient Encounter Medications as of 09/18/2023  Medication Sig   [DISCONTINUED] BESIVANCE 0.6 % SUSP Place 1 drop into the right eye 3 (three) times daily as directed (Patient not taking: Reported on 06/27/2023)   No facility-administered encounter medications on file as of 09/18/2023.     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    BREAST BIOPSY Right 05/09/2016   neg   BREAST LUMPECTOMY Left 05/26/2016   [facility], DCIS, clear margins, neg LN   BREAST LUMPECTOMY WITH NEEDLE LOCALIZATION Left 05/26/2016   Procedure: BREAST LUMPECTOMY WITH SENTINEL LYMPH NODE BX and needle localization;  Surgeon: Earline Mayotte, MD;  Location: [facility] ORS;  Service: General;  Laterality: Left;   BREAST MAMMOSITE Left 06/26/2016   BUNIONECTOMY  2012   COLONOSCOPY WITH PROPOFOL N/A 03/21/2016   Procedure: COLONOSCOPY WITH PROPOFOL;  Surgeon: Earline Mayotte, MD;  Location: [facility] ENDOSCOPY;  Service: Endoscopy;  Laterality: N/A;   SENTINEL NODE BIOPSY Left 05/26/2016   Procedure: SENTINEL NODE BIOPSY;  Surgeon: Earline Mayotte, MD;  Location: [facility] ORS;  Service: General;  Laterality: Left;   TUBAL LIGATION  1977    Family History  Problem Relation Age of Onset   Healthy Sister    Healthy Brother    Healthy Sister    Healthy Brother    Healthy Brother    Cancer Neg Hx    Breast cancer Neg Hx    Social History   Occupational History   Not on file  Tobacco Use   Smoking status: Former    Current packs/day: 0.00    Average packs/day: 0.5 packs/day for 30.0 years (15.0 ttl pk-yrs)    Types: Cigarettes    Start date: 02/26/1986    Quit date: 02/27/2016    Years since quitting: 7.5   Smokeless tobacco: Never   Tobacco comments:    1/2 ppd since age 44 quit age 82  Substance and Sexual Activity   Alcohol use: No    Alcohol/week: 0.0 standard drinks of alcohol   Drug use: No   Sexual activity: Not on file    Tobacco Counseling Counseling given: Not Answered Tobacco comments: 1/2 ppd since age 4 quit age 58   Immunizations and Health Maintenance Immunization History  Administered Date(s) Administered   Pneumococcal Conjugate-13 07/09/2018   Td 01/04/2015   Tdap 01/04/2015   Zoster Recombinant(Shingrix) 07/09/2018, 09/09/2018   Health Maintenance Due  Topic Date Due   COVID-19 Vaccine (1)  Never done   Pneumonia Vaccine 56+ Years old (2 of 2 - PPSV23 or PCV20) 07/10/2019    Activities of Daily Living    09/18/2023   11:13 AM 06/27/2023    1:15 PM  In your present state of health, do you have any difficulty performing the following activities:  Hearing? 0 0  Vision? 0 0  Difficulty concentrating or making decisions? 0 0  Walking or climbing stairs? 0 0  Dressing or bathing? 0 0  Doing errands, shopping? 0 0  Preparing Food and eating ? N   Using the Toilet? N   In the past six months, have you accidently leaked urine? N   Do you have problems with loss of bowel control? N   Managing your Medications? N   Managing your Finances? N   Housekeeping or managing your  Housekeeping? N     Physical Exam   Physical Exam (optional), or other factors deemed appropriate based on the beneficiary's medical and social history and current clinical standards.   Advanced Directives: Does Patient Have a Medical Advance Directive?: Yes Type of Advance Directive: Healthcare Power of Attorney, Living will  EKG:  EKG not completed; no concerns at this time. EKG on file from 2017.      Assessment:    No PE was completed d/t Welcome to Medicare appt.  Vision/Hearing screen No results found.   Goals      Exercise 150 minutes per week (moderate activity)       Depression Screen    09/18/2023   11:09 AM 06/27/2023    1:14 PM 05/31/2021   10:35 AM 10/01/2020    4:16 PM  PHQ 2/9 Scores  PHQ - 2 Score 0 0 0 0  PHQ- 9 Score 0 0 4      Fall Risk    09/18/2023   11:16 AM  Fall Risk   Falls in the past year? 0  Number falls in past yr: 0  Injury with Fall? 0    Cognitive Function:        09/18/2023   11:17 AM  6CIT Screen  What Year? 0 points  What month? 0 points  What time? 0 points  Count back from 20 0 points  Months in reverse 2 points    Patient Care Team: Jacky Kindle, FNP as PCP - General (Family Medicine) Earline Mayotte, MD (General Surgery)     Plan:   Patient reports increased stress given elevated blood pressure reading; noting that she just left the hospital where she was seeing her sister who has been both acutely and chronically il.  Patient defers vaccination for PNA at this time; education provided.  Things to do to keep yourself healthy  - Exercise at least 30-45 minutes a day, 3-4 days a week.  - Eat a low-fat diet with lots of fruits and vegetables, up to 7-9 servings per day.  - Seatbelts can save your life. Wear them always.  - Smoke detectors on every level of your home, check batteries every year.  - Eye Doctor - have an eye exam every 1-2 years  - Safe sex - if you may be exposed to STDs, use a condom.   - Alcohol -  If you drink, do it moderately, less than 2 drinks per day.  - Health Care Power of Attorney. Choose someone to speak for you if you are not able.  - Depression is common in our stressful world.If you're feeling down  or losing interest in things you normally enjoy, please come in for a visit.  - Violence - If anyone is threatening or hurting you, please call immediately.  Upcoming mammo and DEXA scheduled 10/10/23  I have personally reviewed and noted the following in the patient's chart:   Medical and social history Use of alcohol, tobacco or illicit drugs  Current medications and supplements Functional ability and status Nutritional status Physical activity Advanced directives List of other physicians Hospitalizations, surgeries, and ER visits in previous 12 months Vitals Screenings to include cognitive, depression, and falls Referrals and appointments  In addition, I have reviewed and discussed with patient certain preventive protocols, quality metrics, and best practice recommendations. A written personalized care plan for preventive services as well as general preventive health recommendations were provided to patient.     Jacky Kindle, FNP 09/18/2023

## 2023-10-02 ENCOUNTER — Ambulatory Visit (INDEPENDENT_AMBULATORY_CARE_PROVIDER_SITE_OTHER): Payer: Medicare HMO | Admitting: Family Medicine

## 2023-10-02 DIAGNOSIS — Z91199 Patient's noncompliance with other medical treatment and regimen due to unspecified reason: Secondary | ICD-10-CM

## 2023-10-02 NOTE — Progress Notes (Signed)
Patient was not seen for appt d/t no call, no show, or late arrival >10 mins past appt time.   Elise T Payne, FNP  [facility] [address] [place], [place] [address] [phone] (phone) [phone] (fax) [facility]  

## 2023-10-10 ENCOUNTER — Other Ambulatory Visit: Payer: Self-pay

## 2024-11-24 ENCOUNTER — Encounter: Admitting: Physician Assistant

## 2024-12-01 ENCOUNTER — Encounter: Admitting: Physician Assistant

## 2024-12-01 DIAGNOSIS — Z1231 Encounter for screening mammogram for malignant neoplasm of breast: Secondary | ICD-10-CM

## 2024-12-01 DIAGNOSIS — E559 Vitamin D deficiency, unspecified: Secondary | ICD-10-CM

## 2024-12-01 DIAGNOSIS — G4733 Obstructive sleep apnea (adult) (pediatric): Secondary | ICD-10-CM

## 2024-12-01 DIAGNOSIS — R030 Elevated blood-pressure reading, without diagnosis of hypertension: Secondary | ICD-10-CM

## 2024-12-01 DIAGNOSIS — E7800 Pure hypercholesterolemia, unspecified: Secondary | ICD-10-CM

## 2024-12-01 DIAGNOSIS — Z23 Encounter for immunization: Secondary | ICD-10-CM

## 2024-12-01 DIAGNOSIS — R7303 Prediabetes: Secondary | ICD-10-CM

## 2024-12-02 ENCOUNTER — Ambulatory Visit: Payer: Self-pay

## 2024-12-03 ENCOUNTER — Encounter: Payer: Self-pay | Admitting: Physician Assistant

## 2024-12-03 ENCOUNTER — Ambulatory Visit: Admitting: Physician Assistant

## 2024-12-03 ENCOUNTER — Other Ambulatory Visit (HOSPITAL_COMMUNITY): Admission: RE | Admit: 2024-12-03 | Discharge: 2024-12-03 | Disposition: A | Source: Ambulatory Visit

## 2024-12-03 VITALS — Resp 16 | Wt 186.0 lb

## 2024-12-03 DIAGNOSIS — R7303 Prediabetes: Secondary | ICD-10-CM

## 2024-12-03 DIAGNOSIS — Z1231 Encounter for screening mammogram for malignant neoplasm of breast: Secondary | ICD-10-CM

## 2024-12-03 DIAGNOSIS — E559 Vitamin D deficiency, unspecified: Secondary | ICD-10-CM

## 2024-12-03 DIAGNOSIS — G4733 Obstructive sleep apnea (adult) (pediatric): Secondary | ICD-10-CM

## 2024-12-03 DIAGNOSIS — N898 Other specified noninflammatory disorders of vagina: Secondary | ICD-10-CM

## 2024-12-03 DIAGNOSIS — R030 Elevated blood-pressure reading, without diagnosis of hypertension: Secondary | ICD-10-CM

## 2024-12-03 DIAGNOSIS — M810 Age-related osteoporosis without current pathological fracture: Secondary | ICD-10-CM

## 2024-12-03 DIAGNOSIS — E7800 Pure hypercholesterolemia, unspecified: Secondary | ICD-10-CM

## 2024-12-03 DIAGNOSIS — F4321 Adjustment disorder with depressed mood: Secondary | ICD-10-CM

## 2024-12-03 DIAGNOSIS — R8290 Unspecified abnormal findings in urine: Secondary | ICD-10-CM

## 2024-12-03 DIAGNOSIS — R42 Dizziness and giddiness: Secondary | ICD-10-CM

## 2024-12-03 NOTE — Progress Notes (Unsigned)
 " Established patient visit  Patient: Theresa Acosta   DOB: 1950/12/12   74 y.o. Female  MRN: [id number] Visit Date: 12/03/2024  Today's healthcare provider: Jolynn Spencer, PA-C   Chief Complaint  Patient presents with   Fatigue    dizziness, extremely fatigue, overall not feeling well, needs to lay down to get energy back. Feels that she may have  Anemia or low vitamin D  ONSET: About a month Denies: chest pain (just flutters), SOB or URI symptoms.   Subjective     HPI     Fatigue    Additional comments: dizziness, extremely fatigue, overall not feeling well, needs to lay down to get energy back. Feels that she may have  Anemia or low vitamin D  ONSET: About a month Denies: chest pain (just flutters), SOB or URI symptoms.      Last edited by Rosas, Joseline E, CMA on 12/03/2024  2:21 PM.       Discussed the use of AI scribe software for clinical note transcription with the patient, who gave verbal consent to proceed.  History of Present Illness Theresa Acosta is a 74 year old female with sleep apnea who presents with fatigue and dizziness.  She has brief dizziness and fatigue when changing positions, especially when standing up, which resolves quickly. She denies chest pain, shortness of breath, or palpitations. She previously smoked a pack per day and quit eight months ago.  She has sleep apnea and used a CPAP machine, which she lost two years ago and has not replaced. Since then she has fragmented sleep with frequent awakenings, feels exhausted on waking at 5:30 AM, and is tired throughout the day. She has not had a recent sleep study and does not recall results of a prior one.  She reports anxiety and mild depression that she links to poor sleep and the death of her sister six months ago. 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  She recently developed a stinging sensation with urination and an unusual odor, without frequency  or dysuria. She is not sexually active and has noticed vaginal discharge for the past few days.  She usually eats breakfast and either lunch or dinner with snacks, and drinks little water. She has prediabetes and is aware she needs to improve her diet to prevent progression to diabetes.       12/03/2024    2:26 PM 09/18/2023   11:09 AM 06/27/2023    1:14 PM  Depression screen PHQ 2/9  Decreased Interest 2 0 0  Down, Depressed, Hopeless 0 0 0  PHQ - 2 Score 2 0 0  Altered sleeping 3 0 0  Tired, decreased energy 3 0 0  Change in appetite 1 0 0  Feeling bad or failure about yourself  0 0 0  Trouble concentrating 0 0 0  Moving slowly or fidgety/restless 0 0 0  Suicidal thoughts 0 0 0  PHQ-9 Score 9 0  0   Difficult doing work/chores  Not difficult at all Not difficult at all     Data saved with a previous flowsheet row definition      12/03/2024    2:27 PM 09/18/2023   11:09 AM 06/27/2023    1:14 PM  GAD 7 : Generalized Anxiety Score  Nervous, Anxious, on Edge 2 0  0   Control/stop worrying 1 2  3    Worry too much - different things 1  2   Trouble  relaxing 1 2  2    Restless 0  0   Easily annoyed or irritable 2 0  1   Afraid - awful might happen 0 3  3   Total GAD 7 Score 7  11  Anxiety Difficulty   Not difficult at all     Data saved with a previous flowsheet row definition    Medications: Show/hide medication list[1]  Review of Systems All negative Except see HPI   {Insert previous labs (optional):23779} {See past labs  Heme  Chem  Endocrine  Serology  Results Review (optional):1}   Objective    Resp 16   Wt 186 lb (84.4 kg)   SpO2 99%   BMI 32.95 kg/m  {Insert last BP/Wt (optional):23777}{See vitals history (optional):1}   Physical Exam Vitals reviewed.  Constitutional:      General: She is not in acute distress.    Appearance: Normal appearance. She is well-developed. She is not diaphoretic.  HENT:     Head: Normocephalic and atraumatic.  Eyes:      General: No scleral icterus.    Conjunctiva/sclera: Conjunctivae normal.  Neck:     Thyroid : No thyromegaly.  Cardiovascular:     Rate and Rhythm: Normal rate and regular rhythm.     Pulses: Normal pulses.     Heart sounds: Normal heart sounds. No murmur heard. Pulmonary:     Effort: Pulmonary effort is normal. No respiratory distress.     Breath sounds: Normal breath sounds. No wheezing, rhonchi or rales.  Musculoskeletal:     Cervical back: Neck supple.     Right lower leg: No edema.     Left lower leg: No edema.  Lymphadenopathy:     Cervical: No cervical adenopathy.  Skin:    General: Skin is warm and dry.     Findings: No rash.  Neurological:     Mental Status: She is alert and oriented to person, place, and time. Mental status is at baseline.  Psychiatric:        Mood and Affect: Mood normal.        Behavior: Behavior normal.      No results found for any visits on 12/03/24.      Assessment & Plan Obstructive sleep apnea Chronic and unstable Moderate to severe obstructive sleep apnea with CPAP machine lost two years ago, contributing to fatigue and possibly elevated blood pressure. - Referred to Dr. Jess for sleep apnea management and reassessment. - Ensured all previous sleep study documents are available to Dr. Jess. Will follow-up  Vaginal discharge, rule out infection Recent onset of vaginal discharge with odor, no sexual activity reported. - Performed cell swab to check for bacterial vaginosis and yeast infection. - Obtained urine sample for urinalysis.  Prediabetes Chronic and stable Abnormal A1c levels, risk of progression to diabetes if dietary habits are not improved. - Advised dietary modifications based on American Diabetic Association guidelines. - Ordered fasting blood work including A1c. Continue lifestyle modifications Will follow-up  Hypercholesterolemia Chronic and unstable Previous abnormal lipid panel. - Ordered fasting lipid  panel. Continue lifestyle modifications Will follow-up  Vitamin D  deficiency Previous abnormal vitamin D  levels. - Ordered vitamin D  level. Will follow-up  Elevated blood pressure Possibly related to lack of CPAP use and other factors. - Ordered comprehensive blood work including CMP and CBC.  Dizziness Intermittent dizziness, possibly related to dehydration and irregular eating habits. - Advised increased water intake. - Recommended regular meals and healthy snacks.  Adjustment disorder with depressed  mood (grief) Mild depression and anxiety related to grief and lack of sleep. - Offered counseling services available in the clinic.   OSA on CPAP (Primary)  - Ambulatory referral to Sleep Studies  Prediabetes  - Hemoglobin A1c  Hypercholesteremia  - Hemoglobin A1c - Urinalysis, Routine w reflex microscopic  Elevated blood pressure reading  - Hemoglobin A1c - Urinalysis, Routine w reflex microscopic  Encounter for screening mammogram for malignant neoplasm of breast  - MM 3D SCREENING MAMMOGRAM BILATERAL BREAST; Future  Dizziness Cbc, cmp pending - EKG 12-Lead - Hemoglobin A1c - Urinalysis, Routine w reflex microscopic  Age related osteoporosis, unspecified pathological fracture presence  Vitamin D  deficiency  Grief    Bad odor of urine  - Urinalysis, Routine w reflex microscopic  Vaginal discharge  - Cervicovaginal ancillary only  Orders Placed This Encounter  Procedures   MM 3D SCREENING MAMMOGRAM BILATERAL BREAST    Standing Status:   Future    Expiration Date:   01/31/2026    Reason for Exam (SYMPTOM  OR DIAGNOSIS REQUIRED):   screen    Preferred imaging location?:   [facility]   EKG 12-Lead    No follow-ups on file.   The patient was advised to call back or seek an in-person evaluation if the symptoms worsen or if the condition fails to improve as anticipated.  I discussed the assessment and treatment plan with the patient. The  patient was provided an opportunity to ask questions and all were answered. The patient agreed with the plan and demonstrated an understanding of the instructions.  I, Elyssia Strausser, PA-C have reviewed all documentation for this visit. The documentation on 12/03/2024  for the exam, diagnosis, procedures, and orders are all accurate and complete.  Jolynn Spencer, [facility], MMS [facility] [phone] (phone) [phone] (fax)  [facility]     [1]  No outpatient medications prior to visit.   No facility-administered medications prior to visit.   "

## 2024-12-04 LAB — URINALYSIS, ROUTINE W REFLEX MICROSCOPIC
Bilirubin, UA: NEGATIVE
Glucose, UA: NEGATIVE
Ketones, UA: NEGATIVE
Nitrite, UA: NEGATIVE
Protein,UA: NEGATIVE
RBC, UA: NEGATIVE
Specific Gravity, UA: 1.018 (ref 1.005–1.030)
Urobilinogen, Ur: 1 mg/dL (ref 0.2–1.0)
pH, UA: 7.5 (ref 5.0–7.5)

## 2024-12-04 LAB — MICROSCOPIC EXAMINATION: Casts: NONE SEEN /LPF

## 2024-12-04 LAB — SPECIMEN STATUS REPORT

## 2024-12-05 ENCOUNTER — Ambulatory Visit: Payer: Self-pay

## 2024-12-05 ENCOUNTER — Telehealth: Payer: Self-pay

## 2024-12-05 LAB — CERVICOVAGINAL ANCILLARY ONLY
Bacterial Vaginitis (gardnerella): POSITIVE — AB
Candida Glabrata: NEGATIVE
Candida Vaginitis: NEGATIVE
Comment: NEGATIVE
Comment: NEGATIVE
Comment: NEGATIVE

## 2024-12-05 LAB — HEMOGLOBIN A1C
Est. average glucose Bld gHb Est-mCnc: 123 mg/dL
Hgb A1c MFr Bld: 5.9 % — ABNORMAL HIGH (ref 4.8–5.6)

## 2024-12-05 NOTE — Telephone Encounter (Signed)
 fyi

## 2024-12-05 NOTE — Telephone Encounter (Signed)
 Copied from CRM [id number]. Topic: Clinical - Medical Advice >> Dec 05, 2024  1:47 PM Theresa Acosta wrote: Pt is returning a call I gave her the message she said just let her know if a urine sample is needed

## 2024-12-09 ENCOUNTER — Ambulatory Visit: Admitting: Sleep Medicine

## 2024-12-22 ENCOUNTER — Encounter

## 2024-12-25 ENCOUNTER — Encounter: Admitting: Physician Assistant

## 2025-01-21 ENCOUNTER — Encounter: Admitting: Physician Assistant
